# Patient Record
Sex: Female | Born: 1935 | Race: White | Hispanic: No | Smoking: Never smoker
Health system: Southern US, Community
[De-identification: ages and names within clinical notes are randomized; demographics above are authoritative.]

## PROBLEM LIST (undated history)

## (undated) DIAGNOSIS — I4891 Unspecified atrial fibrillation: Secondary | ICD-10-CM

## (undated) DIAGNOSIS — I509 Heart failure, unspecified: Secondary | ICD-10-CM

## (undated) DIAGNOSIS — R809 Proteinuria, unspecified: Secondary | ICD-10-CM

## (undated) DIAGNOSIS — D649 Anemia, unspecified: Secondary | ICD-10-CM

## (undated) DIAGNOSIS — E7800 Pure hypercholesterolemia, unspecified: Secondary | ICD-10-CM

## (undated) DIAGNOSIS — I059 Rheumatic mitral valve disease, unspecified: Secondary | ICD-10-CM

## (undated) HISTORY — PX: ABDOMINAL HYSTERECTOMY: SHX81

## (undated) HISTORY — PX: TOTAL KNEE ARTHROPLASTY: SHX125

## (undated) HISTORY — PX: CARDIAC SURGERY: SHX584

---

## 2005-09-05 ENCOUNTER — Other Ambulatory Visit: Payer: Self-pay

## 2005-09-05 ENCOUNTER — Emergency Department: Payer: Self-pay | Admitting: Emergency Medicine

## 2005-12-02 ENCOUNTER — Ambulatory Visit: Payer: Self-pay | Admitting: Ophthalmology

## 2006-02-12 ENCOUNTER — Ambulatory Visit: Payer: Self-pay | Admitting: Ophthalmology

## 2006-02-19 ENCOUNTER — Ambulatory Visit: Payer: Self-pay | Admitting: Ophthalmology

## 2006-04-24 ENCOUNTER — Ambulatory Visit: Payer: Self-pay | Admitting: Ophthalmology

## 2006-04-30 ENCOUNTER — Ambulatory Visit: Payer: Self-pay | Admitting: Ophthalmology

## 2007-04-06 ENCOUNTER — Other Ambulatory Visit: Payer: Self-pay

## 2007-04-06 ENCOUNTER — Ambulatory Visit: Payer: Self-pay | Admitting: Ophthalmology

## 2007-04-09 ENCOUNTER — Emergency Department: Payer: Self-pay | Admitting: Emergency Medicine

## 2007-05-05 ENCOUNTER — Ambulatory Visit: Payer: Self-pay | Admitting: Ophthalmology

## 2007-05-13 ENCOUNTER — Ambulatory Visit: Payer: Self-pay | Admitting: Ophthalmology

## 2008-10-27 ENCOUNTER — Ambulatory Visit: Payer: Self-pay | Admitting: Family Medicine

## 2009-10-16 ENCOUNTER — Emergency Department: Payer: Self-pay | Admitting: Unknown Physician Specialty

## 2009-10-21 ENCOUNTER — Inpatient Hospital Stay: Payer: Self-pay | Admitting: Internal Medicine

## 2009-11-24 ENCOUNTER — Ambulatory Visit: Payer: Self-pay | Admitting: Cardiology

## 2009-12-25 ENCOUNTER — Inpatient Hospital Stay: Payer: Self-pay | Admitting: *Deleted

## 2009-12-30 ENCOUNTER — Ambulatory Visit: Payer: Self-pay | Admitting: Internal Medicine

## 2010-10-18 ENCOUNTER — Ambulatory Visit: Payer: Self-pay | Admitting: Unknown Physician Specialty

## 2010-12-13 ENCOUNTER — Ambulatory Visit: Payer: Self-pay | Admitting: Pain Medicine

## 2010-12-31 ENCOUNTER — Ambulatory Visit: Payer: Self-pay | Admitting: Pain Medicine

## 2011-02-28 ENCOUNTER — Ambulatory Visit: Payer: Self-pay | Admitting: Urology

## 2011-03-14 ENCOUNTER — Ambulatory Visit: Payer: Self-pay | Admitting: Urology

## 2011-03-19 ENCOUNTER — Inpatient Hospital Stay: Payer: Self-pay | Admitting: *Deleted

## 2012-03-19 ENCOUNTER — Ambulatory Visit: Payer: Self-pay | Admitting: Family Medicine

## 2012-03-19 LAB — URINALYSIS, COMPLETE
Nitrite: POSITIVE
Protein: NEGATIVE
Specific Gravity: 1.015 (ref 1.003–1.030)

## 2012-03-26 ENCOUNTER — Emergency Department: Payer: Self-pay | Admitting: Emergency Medicine

## 2012-03-26 LAB — URINALYSIS, COMPLETE
Bacteria: NONE SEEN
Bilirubin,UR: NEGATIVE
Blood: NEGATIVE
Glucose,UR: NEGATIVE mg/dL (ref 0–75)
Nitrite: NEGATIVE
Ph: 7 (ref 4.5–8.0)
RBC,UR: 1 /HPF (ref 0–5)
Specific Gravity: 1.01 (ref 1.003–1.030)
Squamous Epithelial: 2
WBC UR: NONE SEEN /HPF (ref 0–5)

## 2012-03-26 LAB — COMPREHENSIVE METABOLIC PANEL
Albumin: 3.2 g/dL — ABNORMAL LOW (ref 3.4–5.0)
Anion Gap: 5 — ABNORMAL LOW (ref 7–16)
BUN: 23 mg/dL — ABNORMAL HIGH (ref 7–18)
Calcium, Total: 9.4 mg/dL (ref 8.5–10.1)
Chloride: 101 mmol/L (ref 98–107)
Co2: 31 mmol/L (ref 21–32)
Creatinine: 1.26 mg/dL (ref 0.60–1.30)
EGFR (African American): 47 — ABNORMAL LOW
EGFR (Non-African Amer.): 40 — ABNORMAL LOW
Potassium: 4.6 mmol/L (ref 3.5–5.1)
SGOT(AST): 23 U/L (ref 15–37)
Sodium: 137 mmol/L (ref 136–145)
Total Protein: 7.1 g/dL (ref 6.4–8.2)

## 2012-03-26 LAB — TROPONIN I: Troponin-I: <0.02 ng/mL

## 2012-03-26 LAB — TSH: Thyroid Stimulating Horm: 4.9 u[IU]/mL — ABNORMAL HIGH

## 2012-03-26 LAB — CBC
HCT: 34.7 % — ABNORMAL LOW (ref 35.0–47.0)
MCH: 26.3 pg (ref 26.0–34.0)
MCHC: 30.6 g/dL — ABNORMAL LOW (ref 32.0–36.0)
MCV: 86 fL (ref 80–100)
Platelet: 228 10*3/uL (ref 150–440)
RBC: 4.04 10*6/uL (ref 3.80–5.20)
RDW: 16.9 % — ABNORMAL HIGH (ref 11.5–14.5)
WBC: 12.2 10*3/uL — ABNORMAL HIGH (ref 3.6–11.0)

## 2012-03-26 LAB — PROTIME-INR
INR: 2.3
Prothrombin Time: 25.6 secs — ABNORMAL HIGH (ref 11.5–14.7)

## 2012-03-26 LAB — HEMOGLOBIN A1C: Hemoglobin A1C: 9.6 % — ABNORMAL HIGH (ref 4.2–6.3)

## 2012-04-20 ENCOUNTER — Ambulatory Visit: Payer: Self-pay | Admitting: Internal Medicine

## 2012-07-24 ENCOUNTER — Ambulatory Visit: Payer: Self-pay | Admitting: Internal Medicine

## 2012-08-10 ENCOUNTER — Ambulatory Visit: Payer: Self-pay | Admitting: Pain Medicine

## 2012-09-23 ENCOUNTER — Ambulatory Visit: Payer: Self-pay | Admitting: Family Medicine

## 2012-09-23 LAB — URIC ACID: Uric Acid: 3.8 mg/dL (ref 2.6–6.0)

## 2012-12-01 IMAGING — CT CT PELVIS WO/W CM
1 series · 15 of 32 positions shown, 19 images · non-contrast
Comparison: none

REASON FOR EXAM: rectal contrast  recurrent UTI eval for colovesicle
istula
COMMENTS:

PROCEDURE:     CT  - CT PELVIS STANDARD W/WO  - February 28, 2011  [DATE]
RESULT:
TECHNIQUE: Helical noncontrast 5 mm sections were obtained from the superior
iliac crest through the pubic symphysis. This study was performed as an
initial noncontrast CT of the pelvis.

[Series 2: soft tissue · axial · 0.94mm/px · z∈[-324,-70]mm · 15 of 57 slices shown, 19 images]
[im 4/57  soft-tissue]
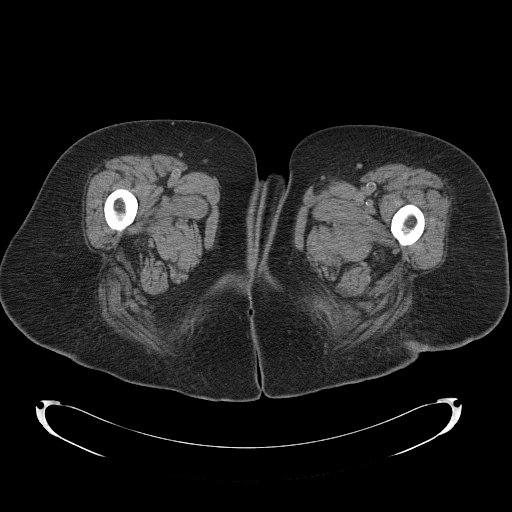
[im 4/57  bone]
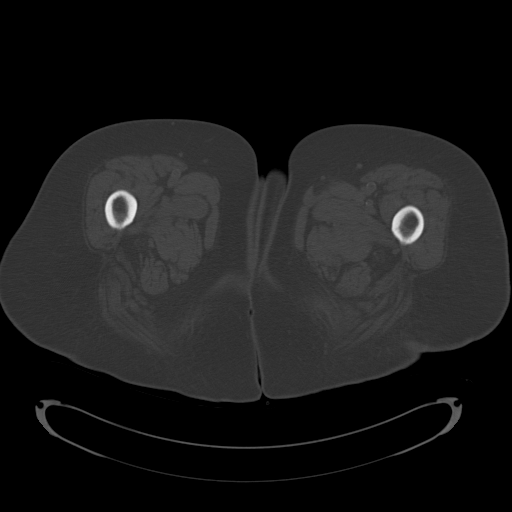
[im 8/57  soft-tissue]
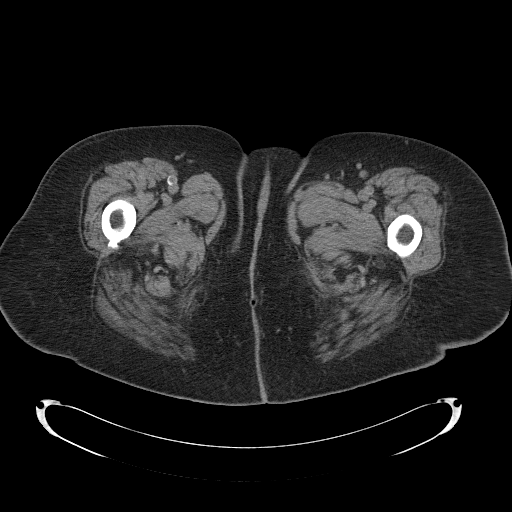
[im 11/57  soft-tissue]
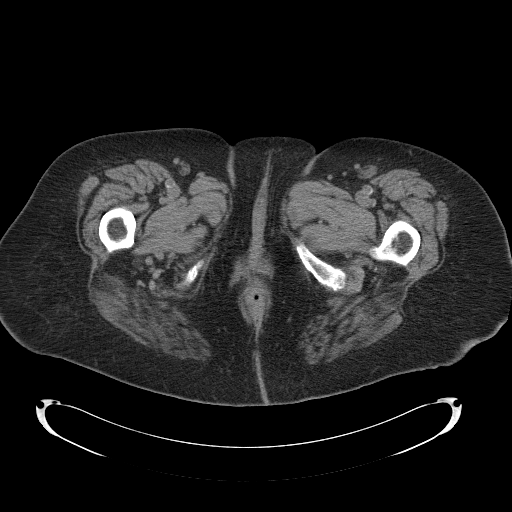
[im 17/57  soft-tissue]
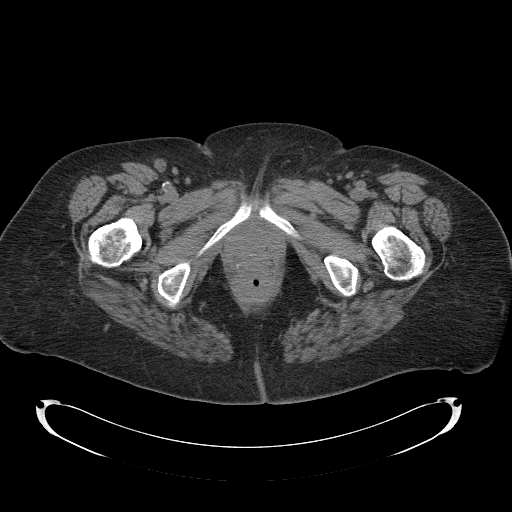
[im 20/57  soft-tissue]
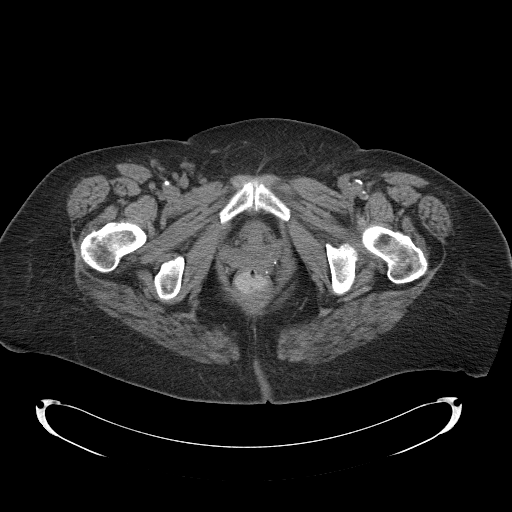
[im 24/57  soft-tissue]
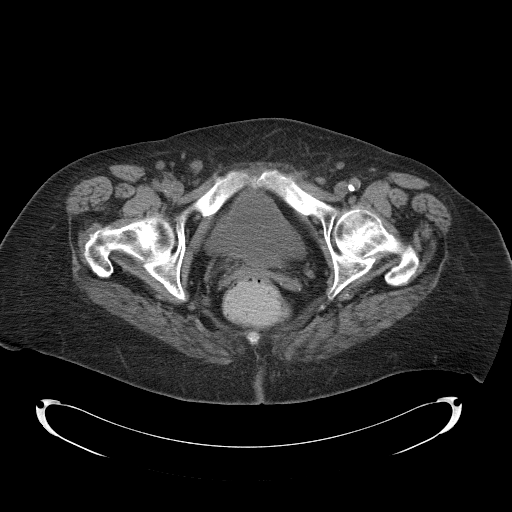
[im 29/57  soft-tissue]
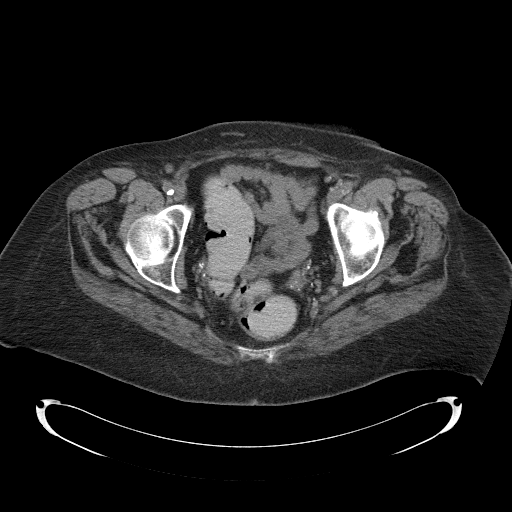
[im 33/57  soft-tissue]
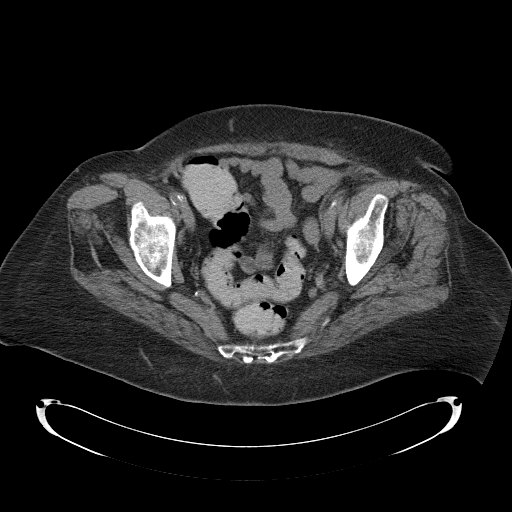
[im 37/57  soft-tissue]
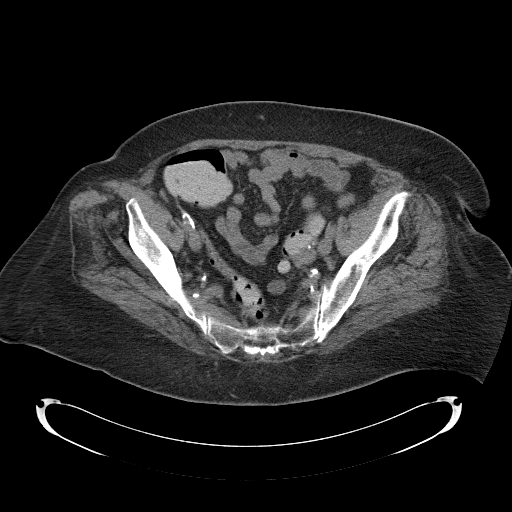
[im 37/57  bone]
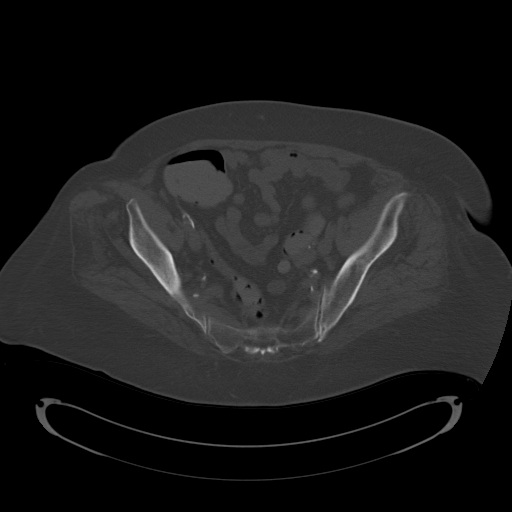
[im 40/57  soft-tissue]
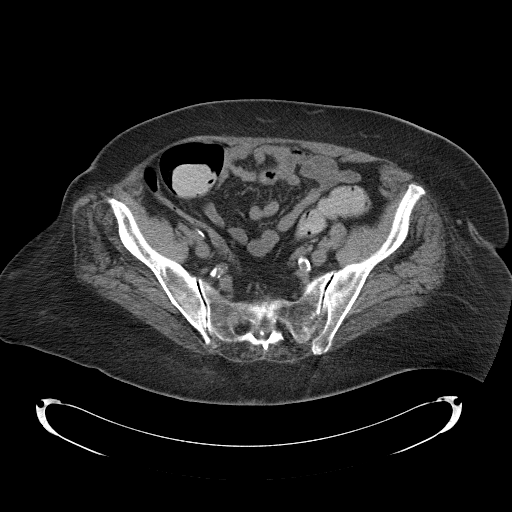
[im 46/57  soft-tissue]
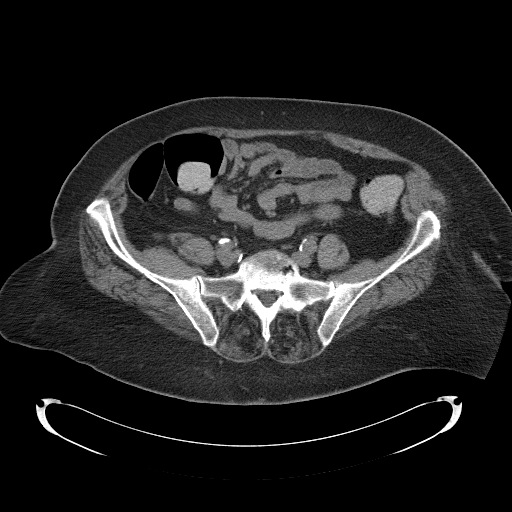
[im 49/57  soft-tissue]
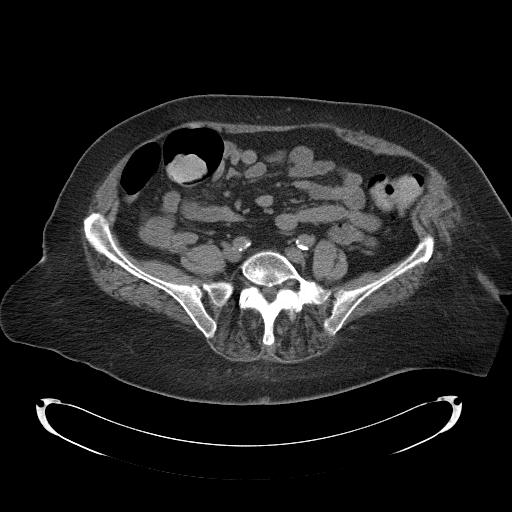
[im 49/57  lung]
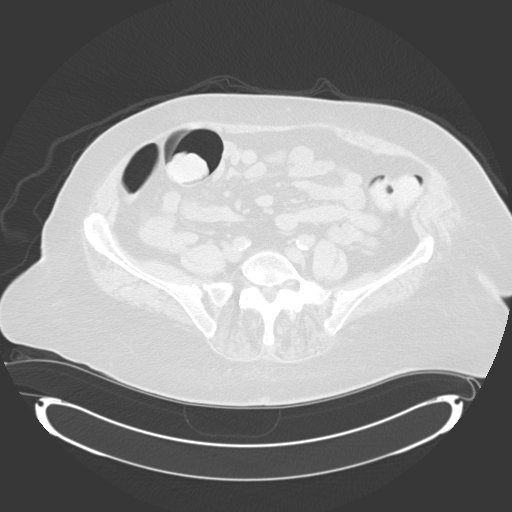
[im 51/57  lung]
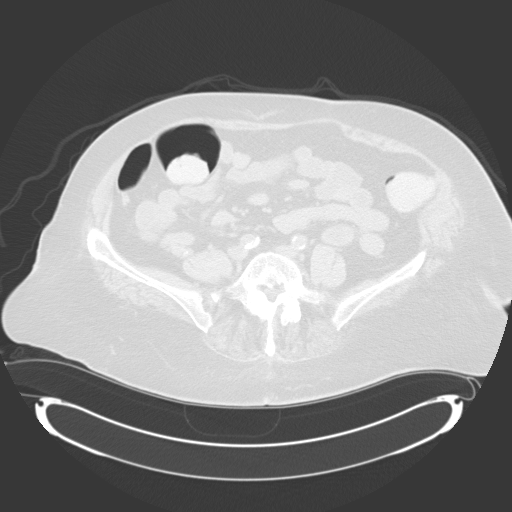
[im 53/57  soft-tissue]
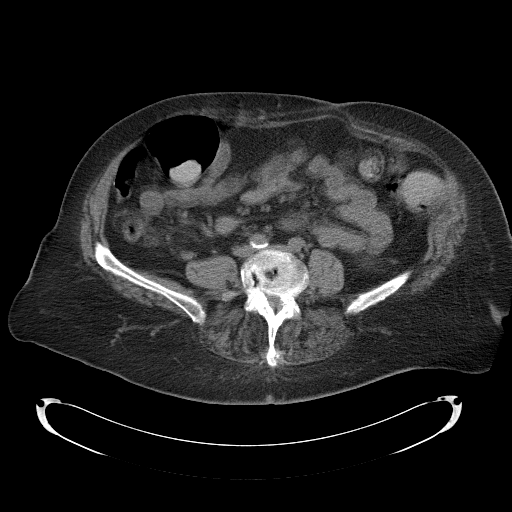
[im 53/57  lung]
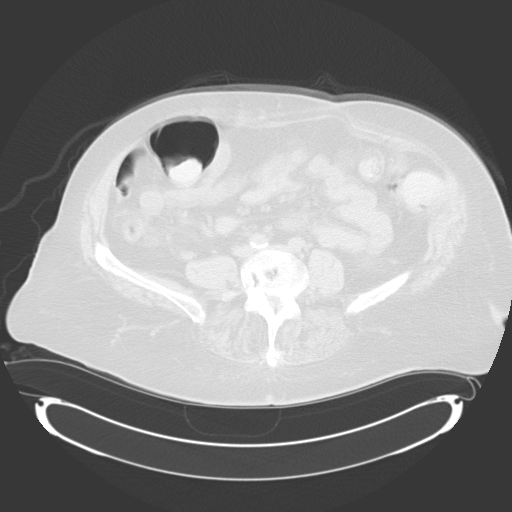
[im 55/57  lung]
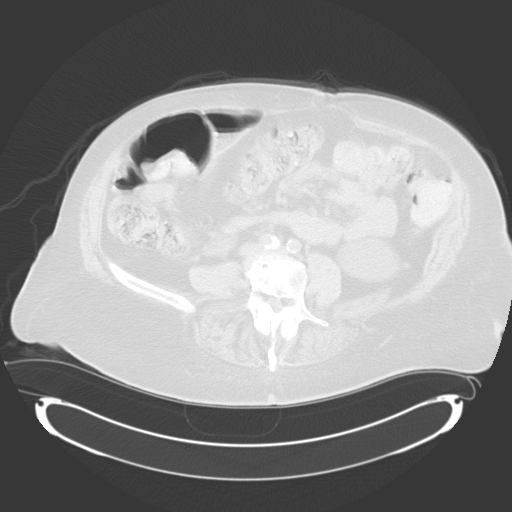

[15 of 32 positions shown; findings below may reference images not displayed]

FINDINGS: The patient was being treated with a laxative for constipation.
The laxative demonstrates radiodensity on the noncontrast CT and thus
negates the ability to evaluate for fistula. The patient has been instructed
to no longer take the laxative until an accurate study of the CT of the
pelvis can be obtained. There is no noncontrast evidence of pelvic masses,
free fluid or loculated fluid collections. Comparison is made to prior CT
dated 10/18/2010 and the inferior border of a left lower pole renal cyst is
once again appreciated. There is no CT evidence of bowel obstruction.
IMPRESSION: 1.     The patient's laxative demonstrates diffuse radiodensity within the
colon. This negates the ability of a pre-noncontrast fistula evaluation. The
patient has been instructed to discontinue laxative use until the pelvic CT
can be obtained after which laxative use can be reobtained. Otherwise, no
gross pelvic abnormalities.
2.     This study was performed at no charge to the patient.

## 2013-06-29 ENCOUNTER — Emergency Department: Payer: Self-pay | Admitting: Emergency Medicine

## 2013-06-29 LAB — CBC
HGB: 10.6 g/dL — ABNORMAL LOW (ref 12.0–16.0)
MCH: 26.4 pg (ref 26.0–34.0)
MCV: 82 fL (ref 80–100)
Platelet: 204 10*3/uL (ref 150–440)
RBC: 4 10*6/uL (ref 3.80–5.20)
RDW: 19.4 % — ABNORMAL HIGH (ref 11.5–14.5)
WBC: 10.8 10*3/uL (ref 3.6–11.0)

## 2013-06-29 LAB — PROTIME-INR: Prothrombin Time: 27.6 secs — ABNORMAL HIGH (ref 11.5–14.7)

## 2013-12-28 IMAGING — CR DG CHEST 2V
1 series · 3 of 3 positions shown · non-contrast
Comparison: none

REASON FOR EXAM: RUQ/Right flank pain
COMMENTS:

PROCEDURE:     DXR - DXR CHEST PA (OR AP) AND LATERAL  - March 26, 2012  [DATE]
RESULT:

[Series 1: pa · 0.17mm/px · 3 of 3 slices shown]
[im 1/3]
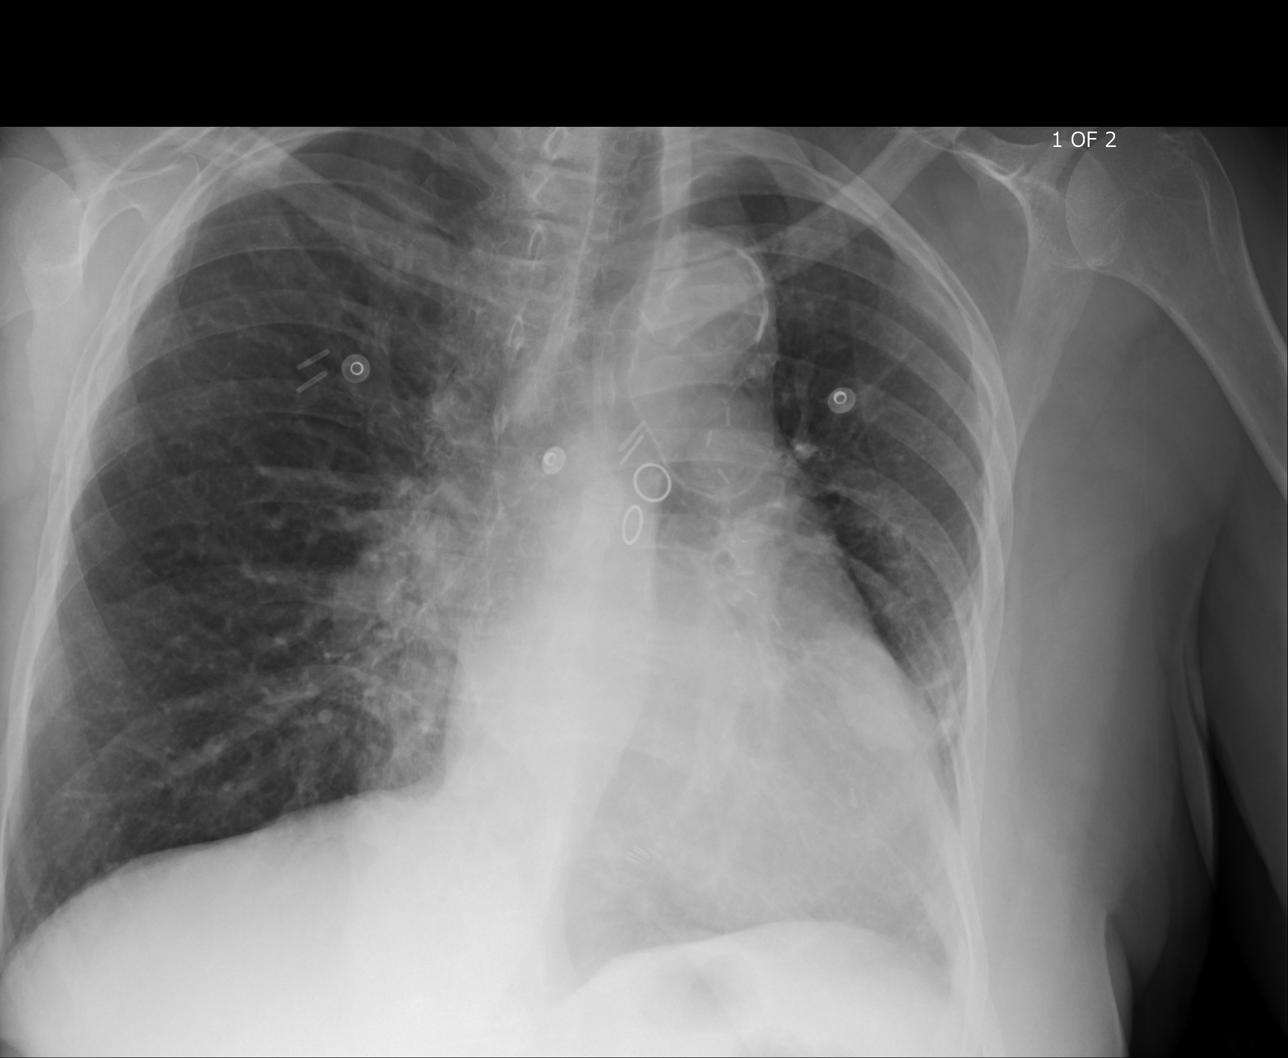
[im 2/3]
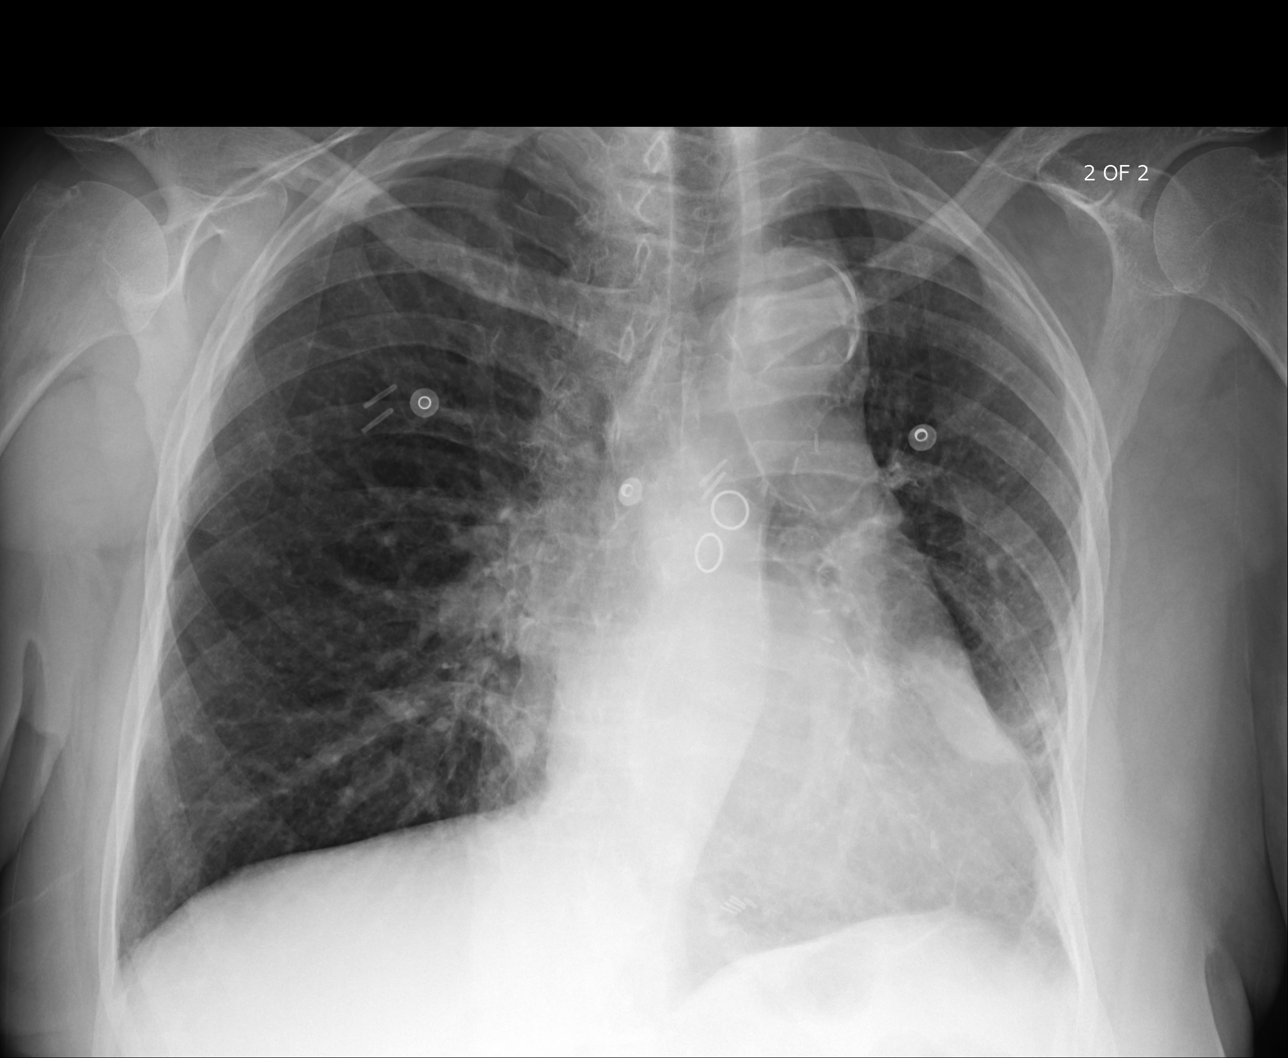
[im 3/3]
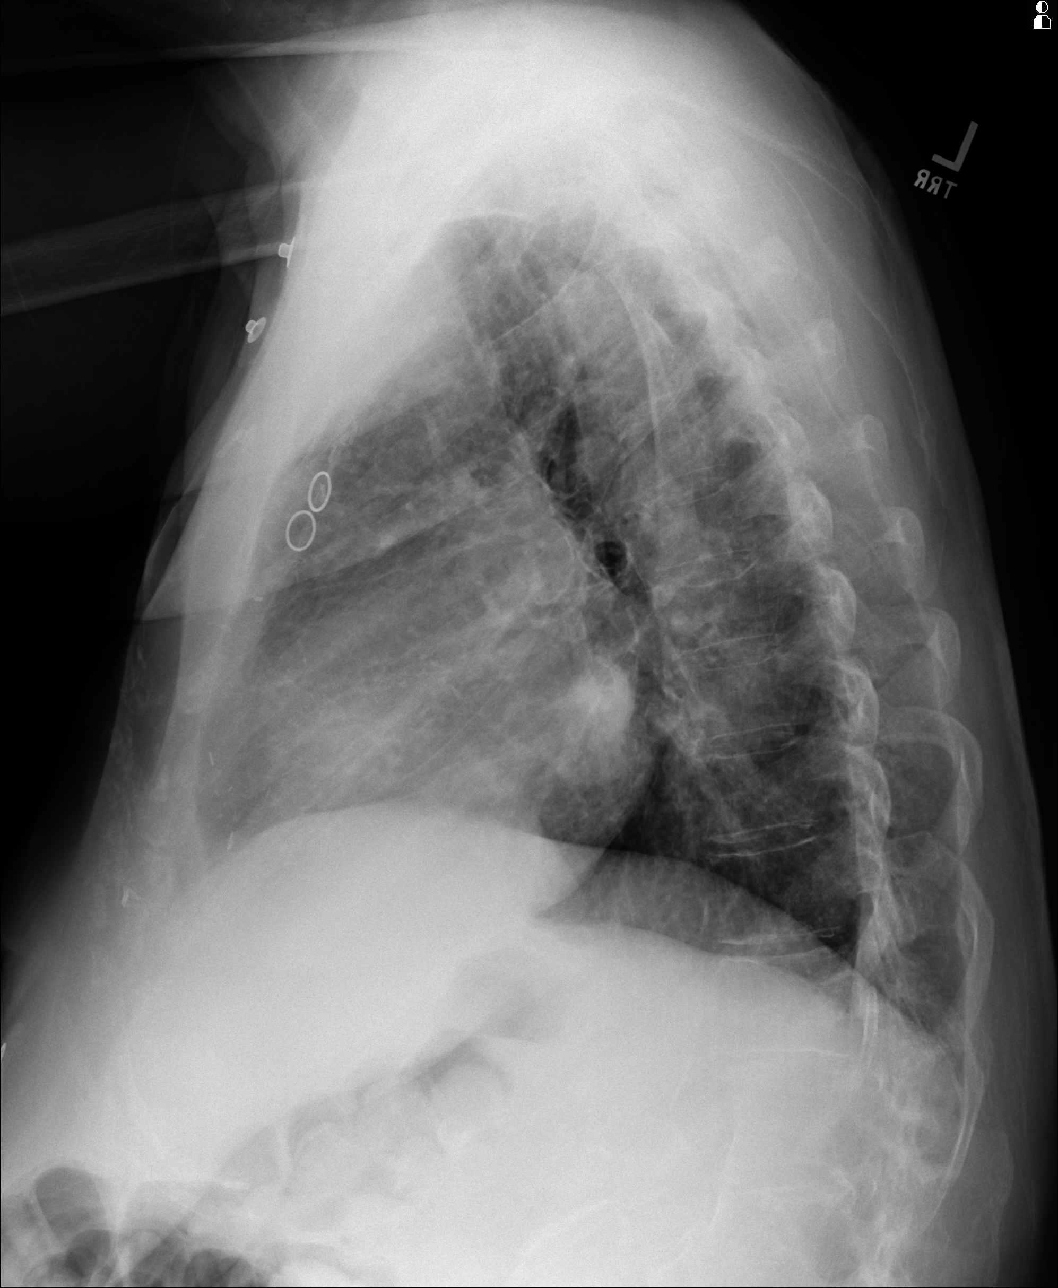

[3 of 3 positions shown; findings below may reference images not displayed]

FINDINGS: An area of increased density projects within the left perihilar
region. This area is not clearly appreciated on the previous study and has a
slightly amorphous nodular appearance. Differential considerations are an
infiltrate versus possibly atelectasis. Note, a pulmonary nodule or small
mass cannot be excluded. Repeat evaluation is recommended. If this finding
persists, further evaluation with CT is recommended.

There is prominence of the interstitial markings, mild. There does not
appear to be significant peribronchial cuffing. No further focal regions of
consolidation or focal infiltrates appreciated. The cardiac silhouette is
enlarged indicative of cardiomegaly. The visualized bony skeleton is
unremarkable.
IMPRESSION: 1.  Atelectasis versus infiltrate versus possibly a mass or nodule within
the region of the lingula.
2.  Pulmonary vascular congestion.
3.  Not mentioned above, there is a component of COPD.
4.  Surveillance and repeat evaluation recommended as described above.

## 2014-04-27 IMAGING — CR DG CHEST 2V
1 series · 2 of 2 positions shown · non-contrast
Comparison: none

REASON FOR EXAM: L shoulder hurts; usually, R shoulder hurts.  No trauma.
 Hx L lung mass.
COMMENTS:

PROCEDURE:     MDR - MDR CHEST PA(OR AP) AND LATERAL  - July 24, 2012  [DATE]
RESULT:     Comparison: None

[Series 1: pa · 0.17mm/px · 2 of 2 slices shown]
[im 1/2]
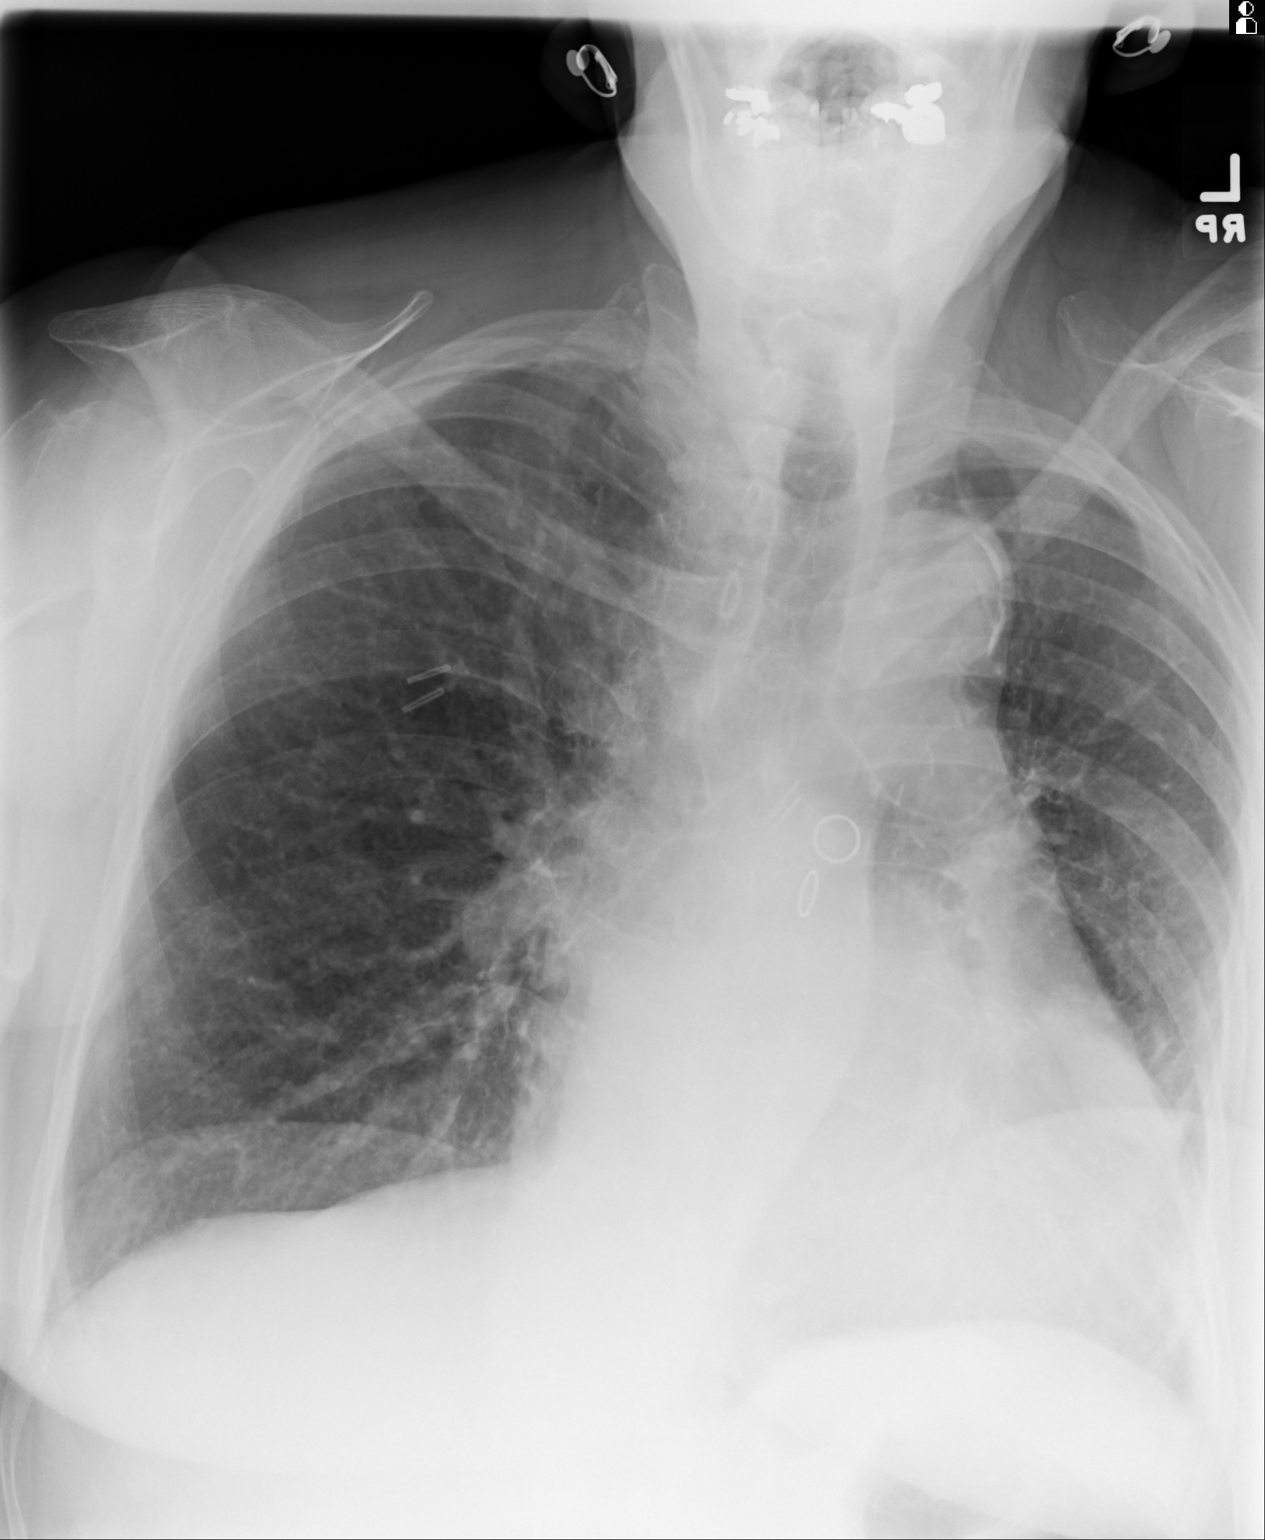
[im 2/2]
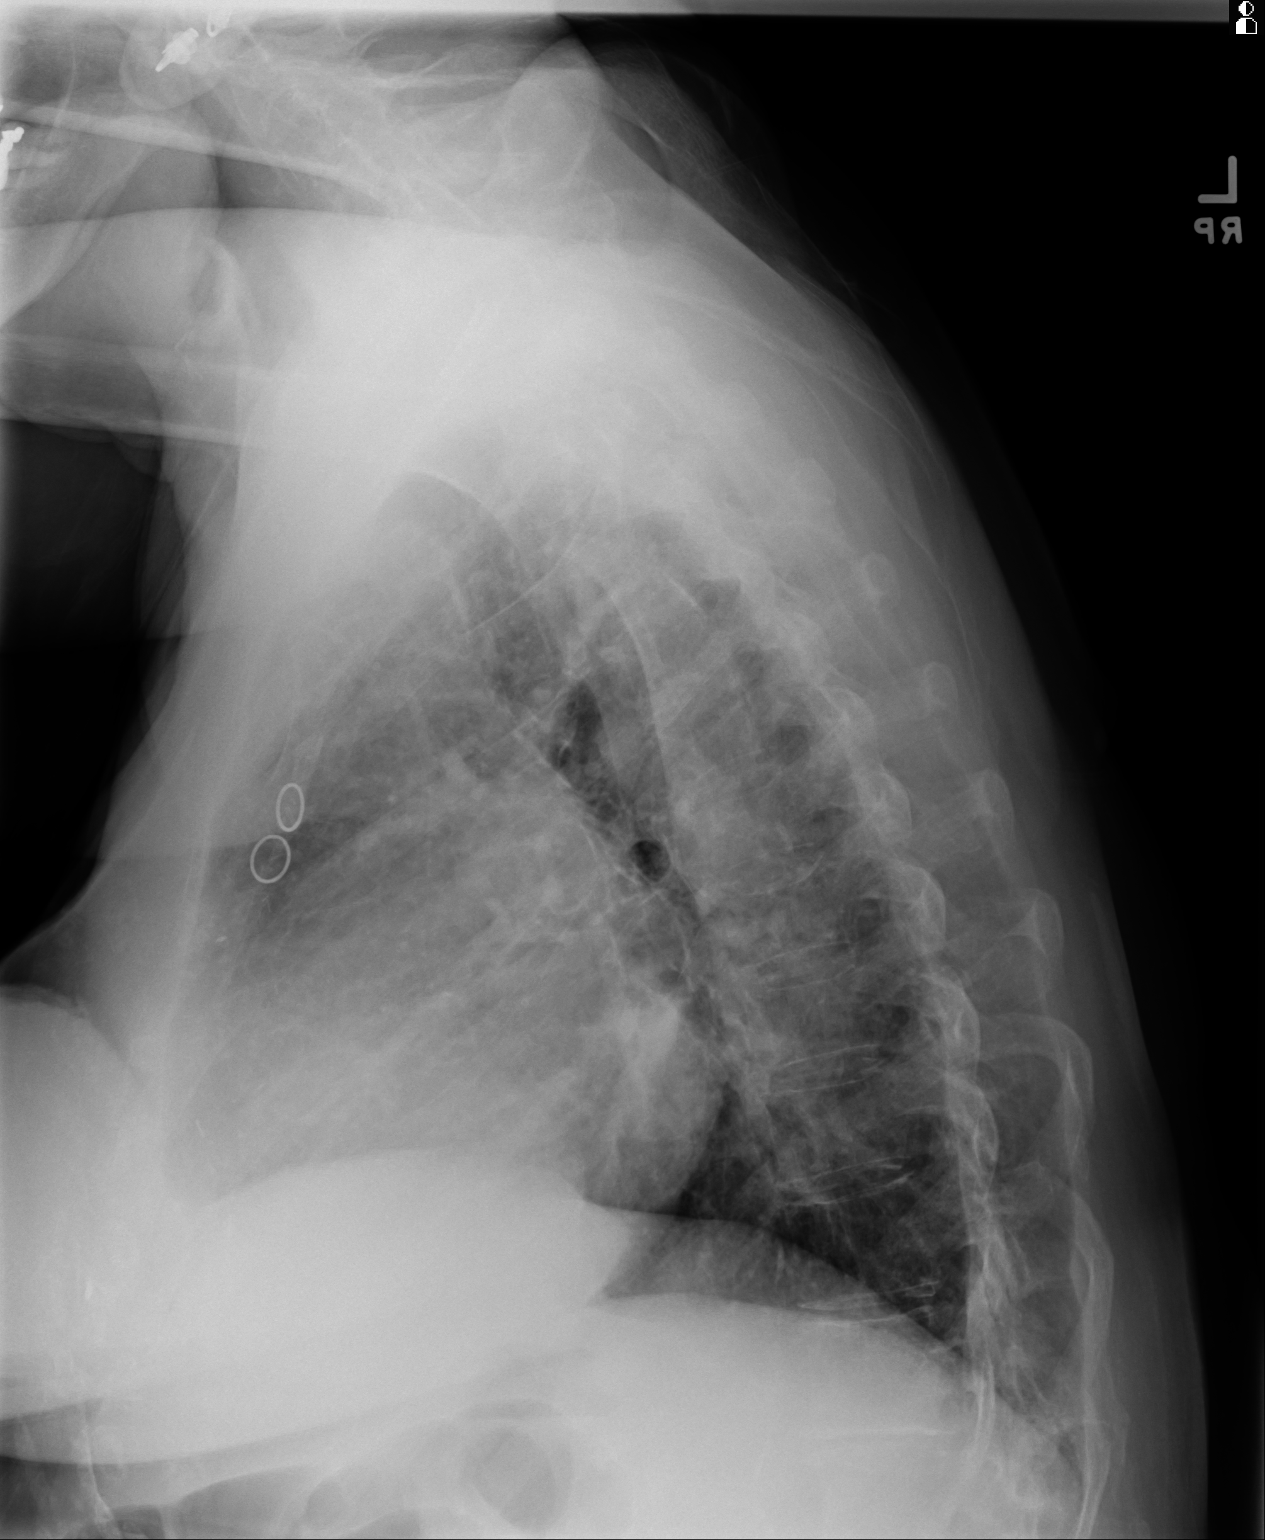

[2 of 2 positions shown; findings below may reference images not displayed]

FINDINGS: PA and lateral chest radiographs are provided. There is bilateral diffuse
interstitial thickening likely representing interstitial edema versus
interstitial pneumonitis secondary to an infectious or inflammatory
etiology. There is no focal parenchymal opacity, pleural effusion, or
pneumothorax. The heart size is enlarged. There is evidence of prior CABG.
The osseous structures are unremarkable.
IMPRESSION: Please see above.

[REDACTED]

## 2014-04-29 ENCOUNTER — Ambulatory Visit: Payer: Self-pay | Admitting: Gastroenterology

## 2014-05-02 LAB — PATHOLOGY REPORT

## 2015-01-02 ENCOUNTER — Ambulatory Visit: Admit: 2015-01-02 | Payer: Self-pay | Admitting: Internal Medicine

## 2015-01-02 ENCOUNTER — Ambulatory Visit
Admit: 2015-01-02 | Disposition: A | Discharge status: Home / Self Care | Payer: Self-pay | Attending: Internal Medicine | Admitting: Internal Medicine

## 2015-02-09 ENCOUNTER — Ambulatory Visit: Payer: Self-pay | Admitting: Internal Medicine

## 2015-02-16 ENCOUNTER — Ambulatory Visit: Payer: Self-pay | Admitting: Internal Medicine

## 2015-02-16 ENCOUNTER — Telehealth: Payer: Self-pay | Admitting: Internal Medicine

## 2015-02-16 NOTE — Telephone Encounter (Signed)
I called to R/S after 2 no shows. Her son said she is very old and that he is the one who has to take her to appts and that he wanted me to cancel and not reschedule. He also said when they went to see Dr. Elmer Ramp they were told everything was fine and he didn't understand why she needed to go to another doctor if she's fine. He said he would talk to Dr. Elmer Ramp to see if this is necessary. For now he said to cancel.

## 2015-09-14 ENCOUNTER — Emergency Department (HOSPITAL_COMMUNITY): Payer: Medicare Other

## 2015-09-14 ENCOUNTER — Inpatient Hospital Stay (HOSPITAL_COMMUNITY): Payer: Medicare Other

## 2015-09-14 ENCOUNTER — Inpatient Hospital Stay (HOSPITAL_COMMUNITY)
Admission: EM | Admit: 2015-09-14 | Discharge: 2015-09-22 | DRG: 208 | Disposition: A | Discharge status: Skilled Nursing Facility | Payer: Medicare Other | Attending: Internal Medicine | Admitting: Internal Medicine

## 2015-09-14 ENCOUNTER — Encounter (HOSPITAL_COMMUNITY): Payer: Self-pay | Admitting: Emergency Medicine

## 2015-09-14 ENCOUNTER — Observation Stay (HOSPITAL_COMMUNITY): Payer: Medicare Other

## 2015-09-14 DIAGNOSIS — I2510 Atherosclerotic heart disease of native coronary artery without angina pectoris: Secondary | ICD-10-CM | POA: Diagnosis present

## 2015-09-14 DIAGNOSIS — I5043 Acute on chronic combined systolic (congestive) and diastolic (congestive) heart failure: Secondary | ICD-10-CM | POA: Diagnosis present

## 2015-09-14 DIAGNOSIS — Z01818 Encounter for other preprocedural examination: Secondary | ICD-10-CM

## 2015-09-14 DIAGNOSIS — E039 Hypothyroidism, unspecified: Secondary | ICD-10-CM | POA: Diagnosis present

## 2015-09-14 DIAGNOSIS — I509 Heart failure, unspecified: Secondary | ICD-10-CM

## 2015-09-14 DIAGNOSIS — I10 Essential (primary) hypertension: Secondary | ICD-10-CM | POA: Diagnosis not present

## 2015-09-14 DIAGNOSIS — E785 Hyperlipidemia, unspecified: Secondary | ICD-10-CM | POA: Diagnosis present

## 2015-09-14 DIAGNOSIS — Z885 Allergy status to narcotic agent status: Secondary | ICD-10-CM

## 2015-09-14 DIAGNOSIS — I5042 Chronic combined systolic (congestive) and diastolic (congestive) heart failure: Secondary | ICD-10-CM | POA: Diagnosis present

## 2015-09-14 DIAGNOSIS — J9601 Acute respiratory failure with hypoxia: Secondary | ICD-10-CM | POA: Diagnosis present

## 2015-09-14 DIAGNOSIS — I248 Other forms of acute ischemic heart disease: Secondary | ICD-10-CM | POA: Diagnosis present

## 2015-09-14 DIAGNOSIS — I5023 Acute on chronic systolic (congestive) heart failure: Secondary | ICD-10-CM | POA: Diagnosis not present

## 2015-09-14 DIAGNOSIS — I080 Rheumatic disorders of both mitral and aortic valves: Secondary | ICD-10-CM | POA: Diagnosis present

## 2015-09-14 DIAGNOSIS — R1310 Dysphagia, unspecified: Secondary | ICD-10-CM | POA: Diagnosis present

## 2015-09-14 DIAGNOSIS — Z7984 Long term (current) use of oral hypoglycemic drugs: Secondary | ICD-10-CM

## 2015-09-14 DIAGNOSIS — M109 Gout, unspecified: Secondary | ICD-10-CM | POA: Diagnosis present

## 2015-09-14 DIAGNOSIS — G9341 Metabolic encephalopathy: Secondary | ICD-10-CM | POA: Diagnosis present

## 2015-09-14 DIAGNOSIS — Z6827 Body mass index (BMI) 27.0-27.9, adult: Secondary | ICD-10-CM

## 2015-09-14 DIAGNOSIS — I4891 Unspecified atrial fibrillation: Secondary | ICD-10-CM | POA: Insufficient documentation

## 2015-09-14 DIAGNOSIS — R0600 Dyspnea, unspecified: Secondary | ICD-10-CM

## 2015-09-14 DIAGNOSIS — Z951 Presence of aortocoronary bypass graft: Secondary | ICD-10-CM

## 2015-09-14 DIAGNOSIS — I130 Hypertensive heart and chronic kidney disease with heart failure and stage 1 through stage 4 chronic kidney disease, or unspecified chronic kidney disease: Secondary | ICD-10-CM | POA: Diagnosis present

## 2015-09-14 DIAGNOSIS — I459 Conduction disorder, unspecified: Secondary | ICD-10-CM | POA: Diagnosis present

## 2015-09-14 DIAGNOSIS — J209 Acute bronchitis, unspecified: Secondary | ICD-10-CM | POA: Diagnosis present

## 2015-09-14 DIAGNOSIS — Z8249 Family history of ischemic heart disease and other diseases of the circulatory system: Secondary | ICD-10-CM

## 2015-09-14 DIAGNOSIS — J810 Acute pulmonary edema: Secondary | ICD-10-CM

## 2015-09-14 DIAGNOSIS — E7800 Pure hypercholesterolemia, unspecified: Secondary | ICD-10-CM | POA: Diagnosis present

## 2015-09-14 DIAGNOSIS — G4700 Insomnia, unspecified: Secondary | ICD-10-CM | POA: Diagnosis not present

## 2015-09-14 DIAGNOSIS — R0902 Hypoxemia: Secondary | ICD-10-CM

## 2015-09-14 DIAGNOSIS — Z794 Long term (current) use of insulin: Secondary | ICD-10-CM

## 2015-09-14 DIAGNOSIS — R29898 Other symptoms and signs involving the musculoskeletal system: Secondary | ICD-10-CM | POA: Diagnosis present

## 2015-09-14 DIAGNOSIS — E1165 Type 2 diabetes mellitus with hyperglycemia: Secondary | ICD-10-CM | POA: Diagnosis present

## 2015-09-14 DIAGNOSIS — I4892 Unspecified atrial flutter: Secondary | ICD-10-CM | POA: Diagnosis present

## 2015-09-14 DIAGNOSIS — M545 Low back pain: Secondary | ICD-10-CM | POA: Diagnosis present

## 2015-09-14 DIAGNOSIS — Z66 Do not resuscitate: Secondary | ICD-10-CM | POA: Diagnosis not present

## 2015-09-14 DIAGNOSIS — Z886 Allergy status to analgesic agent status: Secondary | ICD-10-CM

## 2015-09-14 DIAGNOSIS — J9600 Acute respiratory failure, unspecified whether with hypoxia or hypercapnia: Secondary | ICD-10-CM | POA: Diagnosis present

## 2015-09-14 DIAGNOSIS — N183 Chronic kidney disease, stage 3 (moderate): Secondary | ICD-10-CM | POA: Diagnosis present

## 2015-09-14 DIAGNOSIS — Z7901 Long term (current) use of anticoagulants: Secondary | ICD-10-CM

## 2015-09-14 DIAGNOSIS — N179 Acute kidney failure, unspecified: Secondary | ICD-10-CM | POA: Insufficient documentation

## 2015-09-14 DIAGNOSIS — Z91048 Other nonmedicinal substance allergy status: Secondary | ICD-10-CM

## 2015-09-14 DIAGNOSIS — E1122 Type 2 diabetes mellitus with diabetic chronic kidney disease: Secondary | ICD-10-CM | POA: Diagnosis present

## 2015-09-14 DIAGNOSIS — E876 Hypokalemia: Secondary | ICD-10-CM | POA: Diagnosis not present

## 2015-09-14 DIAGNOSIS — R918 Other nonspecific abnormal finding of lung field: Secondary | ICD-10-CM | POA: Diagnosis present

## 2015-09-14 DIAGNOSIS — I441 Atrioventricular block, second degree: Secondary | ICD-10-CM | POA: Diagnosis present

## 2015-09-14 DIAGNOSIS — G8929 Other chronic pain: Secondary | ICD-10-CM | POA: Diagnosis present

## 2015-09-14 DIAGNOSIS — I482 Chronic atrial fibrillation, unspecified: Secondary | ICD-10-CM | POA: Diagnosis present

## 2015-09-14 DIAGNOSIS — T502X5A Adverse effect of carbonic-anhydrase inhibitors, benzothiadiazides and other diuretics, initial encounter: Secondary | ICD-10-CM | POA: Diagnosis not present

## 2015-09-14 DIAGNOSIS — D696 Thrombocytopenia, unspecified: Secondary | ICD-10-CM | POA: Diagnosis present

## 2015-09-14 DIAGNOSIS — E119 Type 2 diabetes mellitus without complications: Secondary | ICD-10-CM

## 2015-09-14 DIAGNOSIS — Z825 Family history of asthma and other chronic lower respiratory diseases: Secondary | ICD-10-CM

## 2015-09-14 DIAGNOSIS — Z833 Family history of diabetes mellitus: Secondary | ICD-10-CM

## 2015-09-14 DIAGNOSIS — E872 Acidosis: Secondary | ICD-10-CM | POA: Diagnosis present

## 2015-09-14 DIAGNOSIS — J9602 Acute respiratory failure with hypercapnia: Secondary | ICD-10-CM | POA: Diagnosis present

## 2015-09-14 DIAGNOSIS — I255 Ischemic cardiomyopathy: Secondary | ICD-10-CM | POA: Diagnosis present

## 2015-09-14 DIAGNOSIS — R001 Bradycardia, unspecified: Secondary | ICD-10-CM | POA: Diagnosis not present

## 2015-09-14 DIAGNOSIS — E669 Obesity, unspecified: Secondary | ICD-10-CM | POA: Diagnosis present

## 2015-09-14 DIAGNOSIS — E1142 Type 2 diabetes mellitus with diabetic polyneuropathy: Secondary | ICD-10-CM | POA: Diagnosis present

## 2015-09-14 DIAGNOSIS — I480 Paroxysmal atrial fibrillation: Secondary | ICD-10-CM | POA: Diagnosis present

## 2015-09-14 DIAGNOSIS — N189 Chronic kidney disease, unspecified: Secondary | ICD-10-CM

## 2015-09-14 DIAGNOSIS — J851 Abscess of lung with pneumonia: Secondary | ICD-10-CM | POA: Diagnosis present

## 2015-09-14 HISTORY — DX: Heart failure, unspecified: I50.9

## 2015-09-14 HISTORY — DX: Proteinuria, unspecified: R80.9

## 2015-09-14 HISTORY — DX: Unspecified atrial fibrillation: I48.91

## 2015-09-14 HISTORY — DX: Pure hypercholesterolemia, unspecified: E78.00

## 2015-09-14 HISTORY — DX: Anemia, unspecified: D64.9

## 2015-09-14 HISTORY — DX: Rheumatic mitral valve disease, unspecified: I05.9

## 2015-09-14 LAB — CBC WITH DIFFERENTIAL/PLATELET
BASOS PCT: 0 %
Basophils Absolute: 0 10*3/uL (ref 0.0–0.1)
EOS ABS: 0 10*3/uL (ref 0.0–0.7)
EOS PCT: 0 %
HCT: 42.6 % (ref 36.0–46.0)
Hemoglobin: 13.6 g/dL (ref 12.0–15.0)
LYMPHS ABS: 3.5 10*3/uL (ref 0.7–4.0)
Lymphocytes Relative: 38 %
MCH: 28.8 pg (ref 26.0–34.0)
MCHC: 31.9 g/dL (ref 30.0–36.0)
MCV: 90.1 fL (ref 78.0–100.0)
MONOS PCT: 15 %
Monocytes Absolute: 1.4 10*3/uL — ABNORMAL HIGH (ref 0.1–1.0)
Neutro Abs: 4.4 10*3/uL (ref 1.7–7.7)
Neutrophils Relative %: 47 %
PLATELETS: 149 10*3/uL — AB (ref 150–400)
RBC: 4.73 MIL/uL (ref 3.87–5.11)
RDW: 15.4 % (ref 11.5–15.5)
WBC: 9.3 10*3/uL (ref 4.0–10.5)

## 2015-09-14 LAB — GLUCOSE, CAPILLARY
GLUCOSE-CAPILLARY: 151 mg/dL — AB (ref 65–99)
Glucose-Capillary: 151 mg/dL — ABNORMAL HIGH (ref 65–99)

## 2015-09-14 LAB — URINALYSIS, ROUTINE W REFLEX MICROSCOPIC
BILIRUBIN URINE: NEGATIVE
GLUCOSE, UA: NEGATIVE mg/dL
Ketones, ur: NEGATIVE mg/dL
Leukocytes, UA: NEGATIVE
Nitrite: NEGATIVE
PROTEIN: 30 mg/dL — AB
Specific Gravity, Urine: 1.009 (ref 1.005–1.030)
pH: 5 (ref 5.0–8.0)

## 2015-09-14 LAB — DIGOXIN LEVEL: Digoxin Level: 0.7 ng/mL — ABNORMAL LOW (ref 0.8–2.0)

## 2015-09-14 LAB — BASIC METABOLIC PANEL
Anion gap: 12 (ref 5–15)
BUN: 21 mg/dL — AB (ref 6–20)
CALCIUM: 9.3 mg/dL (ref 8.9–10.3)
CHLORIDE: 94 mmol/L — AB (ref 101–111)
CO2: 24 mmol/L (ref 22–32)
CREATININE: 0.91 mg/dL (ref 0.44–1.00)
GFR, EST NON AFRICAN AMERICAN: 57 mL/min — AB (ref >60)
Glucose, Bld: 199 mg/dL — ABNORMAL HIGH (ref 65–99)
Potassium: 4.9 mmol/L (ref 3.5–5.1)
SODIUM: 130 mmol/L — AB (ref 135–145)

## 2015-09-14 LAB — BRAIN NATRIURETIC PEPTIDE: B Natriuretic Peptide: 746.6 pg/mL — ABNORMAL HIGH (ref 0.0–100.0)

## 2015-09-14 LAB — URINE MICROSCOPIC-ADD ON

## 2015-09-14 LAB — I-STAT ARTERIAL BLOOD GAS, ED
ACID-BASE EXCESS: 1 mmol/L (ref 0.0–2.0)
Acid-Base Excess: 4 mmol/L — ABNORMAL HIGH (ref 0.0–2.0)
BICARBONATE: 28.8 meq/L — AB (ref 20.0–24.0)
Bicarbonate: 32 mEq/L — ABNORMAL HIGH (ref 20.0–24.0)
O2 SAT: 97 %
O2 Saturation: 94 %
PCO2 ART: 61.8 mmHg — AB (ref 35.0–45.0)
PO2 ART: 80 mmHg (ref 80.0–100.0)
Patient temperature: 98.6
Patient temperature: 98.6
TCO2: 34 mmol/L (ref 0–100)
TCO2: 31 mmol/L (ref 0–100)
pCO2 arterial: 58.7 mmHg (ref 35.0–45.0)
pH, Arterial: 7.323 — ABNORMAL LOW (ref 7.350–7.450)
pH, Arterial: 7.299 — ABNORMAL LOW (ref 7.350–7.450)
pO2, Arterial: 100 mmHg (ref 80.0–100.0)

## 2015-09-14 LAB — TROPONIN I: TROPONIN I: 0.03 ng/mL (ref <0.031)

## 2015-09-14 LAB — LACTIC ACID, PLASMA
Lactic Acid, Venous: 1.5 mmol/L (ref 0.5–2.0)
Lactic Acid, Venous: 1.4 mmol/L (ref 0.5–2.0)

## 2015-09-14 LAB — PROTIME-INR
INR: 1.76 — AB (ref 0.00–1.49)
Prothrombin Time: 20.5 seconds — ABNORMAL HIGH (ref 11.6–15.2)

## 2015-09-14 LAB — I-STAT CG4 LACTIC ACID, ED: Lactic Acid, Venous: 2.13 mmol/L (ref 0.5–2.0)

## 2015-09-14 LAB — MRSA PCR SCREENING: MRSA BY PCR: NEGATIVE

## 2015-09-14 LAB — APTT: aPTT: 36 seconds (ref 24–37)

## 2015-09-14 MED ORDER — FUROSEMIDE 10 MG/ML IJ SOLN
80.0000 mg | Freq: Two times a day (BID) | INTRAMUSCULAR | Status: DC
  Administered 2015-09-14 (×2): 80 mg via INTRAVENOUS
  Filled 2015-09-14 (×2): qty 8

## 2015-09-14 MED ORDER — ALLOPURINOL 300 MG PO TABS
300.0000 mg | ORAL_TABLET | Freq: Every day | ORAL | Status: DC
  Administered 2015-09-14: 300 mg via ORAL
  Filled 2015-09-14 (×2): qty 1

## 2015-09-14 MED ORDER — ONDANSETRON HCL 4 MG PO TABS: 4.0000 mg | ORAL_TABLET | Freq: Four times a day (QID) | ORAL | Status: DC | PRN

## 2015-09-14 MED ORDER — TRAMADOL HCL 50 MG PO TABS
50.0000 mg | ORAL_TABLET | Freq: Four times a day (QID) | ORAL | Status: DC | PRN
  Administered 2015-09-14 – 2015-09-17 (×3): 50 mg via ORAL
  Filled 2015-09-14 (×4): qty 1

## 2015-09-14 MED ORDER — METOPROLOL TARTRATE 50 MG PO TABS
50.0000 mg | ORAL_TABLET | Freq: Two times a day (BID) | ORAL | Status: DC
  Administered 2015-09-14 (×2): 50 mg via ORAL
  Filled 2015-09-14 (×4): qty 1

## 2015-09-14 MED ORDER — ONDANSETRON HCL 4 MG/2ML IJ SOLN: 4.0000 mg | Freq: Four times a day (QID) | INTRAMUSCULAR | Status: DC | PRN

## 2015-09-14 MED ORDER — IOHEXOL 350 MG/ML SOLN
100.0000 mL | Freq: Once | INTRAVENOUS | Status: AC | PRN
  Administered 2015-09-14: 85 mL via INTRAVENOUS

## 2015-09-14 MED ORDER — LEVOTHYROXINE SODIUM 125 MCG PO TABS
125.0000 ug | ORAL_TABLET | Freq: Every day | ORAL | Status: DC
  Filled 2015-09-14 (×2): qty 1

## 2015-09-14 MED ORDER — DIGOXIN 125 MCG PO TABS
0.1250 mg | ORAL_TABLET | Freq: Every day | ORAL | Status: DC
  Administered 2015-09-14: 0.125 mg via ORAL
  Filled 2015-09-14 (×2): qty 1

## 2015-09-14 MED ORDER — LORAZEPAM 2 MG/ML IJ SOLN
0.5000 mg | Freq: Two times a day (BID) | INTRAMUSCULAR | Status: DC | PRN
  Administered 2015-09-14: 0.5 mg via INTRAVENOUS
  Filled 2015-09-14: qty 1

## 2015-09-14 MED ORDER — MORPHINE SULFATE (PF) 2 MG/ML IV SOLN: 2.0000 mg | INTRAVENOUS | Status: DC | PRN

## 2015-09-14 MED ORDER — FUROSEMIDE 10 MG/ML IJ SOLN
40.0000 mg | Freq: Once | INTRAMUSCULAR | Status: AC
  Administered 2015-09-14: 40 mg via INTRAVENOUS
  Filled 2015-09-14: qty 4

## 2015-09-14 MED ORDER — COLCHICINE 0.6 MG PO TABS
0.6000 mg | ORAL_TABLET | Freq: Every day | ORAL | Status: DC
  Administered 2015-09-14: 0.6 mg via ORAL
  Filled 2015-09-14 (×2): qty 1

## 2015-09-14 MED ORDER — INSULIN ASPART 100 UNIT/ML ~~LOC~~ SOLN: 0.0000 [IU] | Freq: Three times a day (TID) | SUBCUTANEOUS | Status: DC

## 2015-09-14 MED ORDER — WARFARIN SODIUM 5 MG PO TABS
6.0000 mg | ORAL_TABLET | Freq: Once | ORAL | Status: AC
  Administered 2015-09-14: 6 mg via ORAL
  Filled 2015-09-14 (×2): qty 1

## 2015-09-14 MED ORDER — HYDRALAZINE HCL 20 MG/ML IJ SOLN
10.0000 mg | Freq: Once | INTRAMUSCULAR | Status: DC
Start: 2015-09-14 — End: 2015-09-19
  Filled 2015-09-14: qty 1

## 2015-09-14 MED ORDER — GABAPENTIN 300 MG PO CAPS
300.0000 mg | ORAL_CAPSULE | Freq: Three times a day (TID) | ORAL | Status: DC
  Administered 2015-09-14 (×2): 300 mg via ORAL
  Filled 2015-09-14 (×5): qty 1

## 2015-09-14 MED ORDER — WARFARIN - PHARMACIST DOSING INPATIENT: Freq: Every day | Status: DC

## 2015-09-14 MED ORDER — DOCUSATE SODIUM 100 MG PO CAPS
100.0000 mg | ORAL_CAPSULE | Freq: Every day | ORAL | Status: DC
  Administered 2015-09-14: 100 mg via ORAL
  Filled 2015-09-14 (×2): qty 1

## 2015-09-14 MED ORDER — SIMVASTATIN 20 MG PO TABS
20.0000 mg | ORAL_TABLET | Freq: Every day | ORAL | Status: DC
  Administered 2015-09-14: 20 mg via ORAL
  Filled 2015-09-14 (×2): qty 1

## 2015-09-14 MED ORDER — POTASSIUM CHLORIDE CRYS ER 20 MEQ PO TBCR
20.0000 meq | EXTENDED_RELEASE_TABLET | Freq: Two times a day (BID) | ORAL | Status: DC
  Administered 2015-09-14: 20 meq via ORAL
  Filled 2015-09-14: qty 1

## 2015-09-14 MED ORDER — HYDRALAZINE HCL 20 MG/ML IJ SOLN: 10.0000 mg | INTRAMUSCULAR | Status: DC | PRN

## 2015-09-14 MED ORDER — LISINOPRIL 10 MG PO TABS
10.0000 mg | ORAL_TABLET | Freq: Every day | ORAL | Status: DC
  Administered 2015-09-14: 10 mg via ORAL
  Filled 2015-09-14 (×2): qty 1

## 2015-09-14 MED ORDER — SODIUM CHLORIDE 0.9 % IJ SOLN
3.0000 mL | Freq: Two times a day (BID) | INTRAMUSCULAR | Status: DC
  Administered 2015-09-14 – 2015-09-22 (×15): 3 mL via INTRAVENOUS

## 2015-09-14 NOTE — ED Notes (Signed)
RT at bedside - pt off bi-pap and on [address] 4L. Will assess if pt maintains O2 sats.

## 2015-09-14 NOTE — ED Notes (Signed)
Monitor indicates hr of 147 and pulse 86 confirmed at radial. Regular perfused pulse of 86. MD aware

## 2015-09-14 NOTE — Plan of Care (Signed)
Problem: Education: Goal: Knowledge of [facility] Education information/materials will improve Outcome: Completed/Met Date Met:  09/14/15 Pt educated and verbalized understanding

## 2015-09-14 NOTE — Progress Notes (Signed)
Patient transferred to CT and back to ED room B-18 without any apparent complications. RT will continue to monitor.

## 2015-09-14 NOTE — ED Notes (Addendum)
Daughter called from [place] -- [phone] --- [name]

## 2015-09-14 NOTE — ED Notes (Signed)
EMS - Patient coming from home with c/o of SOB x couple of days.  Patient seen by PCP today and started on Levoquin with "no immediate improvement".  Hx. Of CHF.  Given 125mg  Solumderol in route.  CPAP on arrival.  Diagnosed with acute bronchitis.  Room air at 87%.

## 2015-09-14 NOTE — ED Provider Notes (Signed)
CSN: [id number]     Arrival date & time 09/14/15  0050 History  By signing my name below, I, Lori Harrington, attest that this documentation has been prepared under the direction and in the presence of Lori Kaplan, MD . Electronically Signed: Freida Harrington, Scribe. 09/14/2015. 2:30 AM.    Chief Complaint  Patient presents with  . Shortness of Breath   LEVEL 5 CAVEAT DUE TO ACUITY OF MEDICAL CONDITION  The history is provided by the patient and the EMS personnel. No language interpreter was used.    HPI Comments:  Lori Harrington is a 82 y.o. female with a history of CHF, AFIB, and failed CABG, who presents to the Emergency Department via ems from home for SOB x 2 days; worse today. Pt saw pcp yesterday (09/13/15) for SOB, was diagnosed with bronchitis and started on Levaquin. EMS reports  rhonci bilaterally and wet cough; pt notes yellow phlegm. Pt reports central CP secondary to cough. Per EMS pt was hypoxic on RA at 87%. She was placed on  CPAP and O2 saturation rose to 100%. Pt was given solumedrol en route and 20 gauge IV was placed in the left AC. Pt denies smoking history and fever.   Past Medical History  Diagnosis Date  . CHF (congestive heart failure) (HCC)   . Anemia   . Mitral valve disorder   . Atrial fibrillation (HCC)   . Pure hypercholesterolemia   . Proteinuria    Past Surgical History  Procedure Laterality Date  . Abdominal hysterectomy    . Cardiac surgery    . Total knee arthroplasty Left    No family history on file. Social History  Substance Use Topics  . Smoking status: Never Smoker   . Smokeless tobacco: None  . Alcohol Use: No   OB History    No data available     Review of Systems  Unable to perform ROS: Acuity of condition    Allergies  Adhesive; Codeine; and Oxycodone  Home Medications   Prior to Admission medications   Not on File   BP 166/77 mmHg  Pulse 70  Resp 24  SpO2 100% Physical Exam  Constitutional: She is oriented to person,  place, and time. She appears well-developed and well-nourished.  HENT:  Head: Normocephalic and atraumatic.  Eyes: Conjunctivae are normal.  Neck: JVD present.  Cardiovascular: Normal rate and regular rhythm.   Pulmonary/Chest: She is in respiratory distress (with retractions over chest).  Diffuse rhonchus BS with bibasilar rales   Abdominal: She exhibits no distension.  Musculoskeletal: She exhibits edema (2+ pitting BLE edema).  Neurological: She is alert and oriented to person, place, and time.  Skin: Skin is warm. She is diaphoretic.  Psychiatric: She has a normal mood and affect.  Nursing note and vitals reviewed.   ED Course  Procedures   DIAGNOSTIC STUDIES:  Oxygen Saturation is 100% on CPAP, normal by my interpretation.    2:24 AM Pt updated with results with son at bedside. Son notes pt is currently on lasix and a blood thinner for AFIB.   Labs Review Labs Reviewed  CBC WITH DIFFERENTIAL/PLATELET - Abnormal; Notable for the following:    Platelets 149 (*)    Monocytes Absolute 1.4 (*)    All other components within normal limits  BASIC METABOLIC PANEL - Abnormal; Notable for the following:    Sodium 130 (*)    Chloride 94 (*)    Glucose, Bld 199 (*)    BUN 21 (*)  GFR calc non Af Amer 57 (*)    All other components within normal limits  BRAIN NATRIURETIC PEPTIDE - Abnormal; Notable for the following:    B Natriuretic Peptide 746.6 (*)    All other components within normal limits  PROTIME-INR - Abnormal; Notable for the following:    Prothrombin Time 20.5 (*)    INR 1.76 (*)    All other components within normal limits  I-STAT CG4 LACTIC ACID, ED - Abnormal; Notable for the following:    Lactic Acid, Venous 2.13 (*)    All other components within normal limits  TROPONIN I  APTT  URINALYSIS, ROUTINE W REFLEX MICROSCOPIC (NOT AT [facility])    Imaging Review Dg Chest Port 1 View  09/14/2015  CLINICAL DATA:  Acute onset of respiratory distress and shortness of  breath. Initial encounter. EXAM: PORTABLE CHEST 1 VIEW COMPARISON:  None. FINDINGS: The lungs are well-aerated. Vascular congestion is noted. Mildly increased interstitial markings may reflect mild interstitial edema. No definite pleural effusion or pneumothorax is seen. The cardiomediastinal silhouette is mildly enlarged. The patient is status post CABG. No acute osseous abnormalities are seen. IMPRESSION: Vascular congestion and mild cardiomegaly. Mildly increased interstitial markings may reflect mild interstitial edema. Electronically Signed   By: Roanna RaiderJeffery  Chang M.D.   On: 09/14/2015 01:59   I have personally reviewed and evaluated these images and lab results as part of my medical decision-making.   EKG Interpretation   Date/Time:  Thursday September 14 2015 01:01:10 EST Ventricular Rate:  125 PR Interval:    QRS Duration: 131 QT Interval:  339 QTC Calculation: 489 R Axis:   -70 Text Interpretation:  Atrial flutter with predominant 2:1 AV block LVH  with IVCD, LAD and secondary repol abnrm Inferior infarct, old Nonspecific  ST and T wave abnormality No acute changes Confirmed by Rhunette CroftNANAVATI, MD,  Janey GentaANKIT [phone]([id number]) on 09/14/2015 1:18:32 AM      MDM   Final diagnoses:  Acute respiratory failure with hypoxia (HCC)  Acute pulmonary edema (HCC)  Acute on chronic congestive heart failure, unspecified congestive heart failure type (HCC)   I personally performed the services described in this documentation, which was scribed in my presence. The recorded information has been reviewed and is accurate.  Pt comes in with acute hypoxic resp failure. Treated as bronchitis for the cough and wheezing by pcp, with levaquin. Acutely worsened overnight. She is tachycardic and tachypneic - and her acute presentation is consistent with acute hypoxic resp failure from pulmonary edema/flash pulm edema. She has hx of CHF and afib and is tachycardic. She is taking antibitiocs that were prescribed and has no risk  factors for PE. + jvd, bnp elevation w/o and WC elevation, and no fevers, solikely best to continue on levaquin w/o escalation of antibiotics for now.  Will continue bipap.  CRITICAL CARE Performed by: Lori KaplanNanavati, Patryce Depriest   Total critical care time:48 minutes  Critical care time was exclusive of separately billable procedures and treating other patients.  Critical care was necessary to treat or prevent imminent or life-threatening deterioration.  Critical care was time spent personally by me on the following activities: development of treatment plan with patient and/or surrogate as well as nursing, discussions with consultants, evaluation of patient's response to treatment, examination of patient, obtaining history from patient or surrogate, ordering and performing treatments and interventions, ordering and review of laboratory studies, ordering and review of radiographic studies, pulse oximetry and re-evaluation of patient's condition.   Lori KaplanAnkit Railey Glad, MD 10/14/15 [phone]

## 2015-09-14 NOTE — Progress Notes (Signed)
Patient is refusing to wear CPAP. BIPAP machine is in the room. Patient is on 3 LPM nasal cannula. No distress. SPO2 99%. RT will continue to monitor.

## 2015-09-14 NOTE — ED Notes (Signed)
Pt c/o difficulty breathing on [place] - RT at bedside to place pt on bipap

## 2015-09-14 NOTE — Progress Notes (Signed)
RT increased patients bi-pap settings to help with the increased CO2. RT will continue to monitor.

## 2015-09-14 NOTE — H&P (Signed)
[facility] History and Physical  Lori Harrington ZOX:[id number] DOB: 10/31/1929 DOA: 09/14/2015  Referring physician: Emergency Department PCP: No primary care provider on file.   CHIEF COMPLAINT:   Shortness of breath    HPI: Lori Harrington is a 82 y.o. female with multiple medical problems. She desaturates off Bipap so history is difficult to take. Patient's medical providers are from [facility] where she lives. Patient states she became SOB yesterday. No associated chest pain. She is taking home meds. She was hypoxic in 80's when EMS arrived, placed on cpcp.  Records reviewed in Care Everywhere. Patient prescribed Levaquin for cough and sinusitis yesterday by PCP.   ED COURSE:           Labs:   Na+ 130, BUN 21, Cr 0.91, glucose 199 BNP 746, trop 0.03 Lactic acid 2.13 WBC 9.3, hgb 13.6 , platelets 149 INR 1.76  CXR:    Vascular congestion and mild cardiomegaly. Mildly increased interstitial markings may reflect mild interstitial edema.         EKG:    Atrial flutter with predominant 2:1 AV block LVH with IVCD, LAD and secondary repol abnrm Inferior infarct, old Nonspecific ST and T wave abnormality No acute changes Confirmed by Rhunette Croft, MD, Janey Genta [phone]) on 09/14/2015 1:18:32 AM                   Medications  hydrALAZINE (APRESOLINE) injection 10 mg (10 mg Intravenous Not Given 09/14/15 0256)  hydrALAZINE (APRESOLINE) injection 10 mg (not administered)  furosemide (LASIX) injection 40 mg (40 mg Intravenous Given 09/14/15 0257)    Review of Systems  Unable to perform ROS: acuity of condition  Limited ROS  Positive for chronic BLE edema, L>R. No chest pain. No significant cough.  Limited review of systems, patient unable to be off bipap without SOB and desaturation   Past Medical History  Diagnosis Date  . CHF (congestive heart failure) (HCC)   . Anemia   . Mitral valve disorder   . Atrial fibrillation (HCC)   . Pure hypercholesterolemia   . Proteinuria    Past  Surgical History  Procedure Laterality Date  . Abdominal hysterectomy    . Cardiac surgery    . Total knee arthroplasty Left     SOCIAL HISTORY:  reports that she has never smoked. She does not have any smokeless tobacco history on file. She reports that she does not drink alcohol or use illicit drugs. Lives: at home alone   Assistive devices:   None needed for ambulation.   Allergies  Allergen Reactions  . Adhesive [Tape]     Skin turns red and itching   . Codeine     Stop breathing   . Oxycodone     unknown    Per records from Care Everywhere Heart attack Mother    Hypertension Mother    Asthma Mother    Coronary artery disease Father    Heart attack Father    Hypertension Father    Diabetes mellitus Brother    Coronary artery disease Brother    Diabetes mellitus Sister    Hypertension Sister    Diabetes mellitus Daughter    Colon cancer Other +FH   Heart attack Other Sibling    Relation Name Status Comments  Mother  Deceased Age:33   Father  Deceased Age:55 S/P MI  Brother  Alive S/P CABG x 3  Brother     Sister     Daughter  Other +FH    Other Sibling      Prior to Admission medications   Medication Sig Start Date End Date Taking? Authorizing Provider  allopurinol (ZYLOPRIM) 300 MG tablet Take 300 mg by mouth daily.   Yes Historical Provider, MD  Cholecalciferol (VITAMIN D-3) 1000 units CAPS Take 1 capsule by mouth daily.   Yes Historical Provider, MD  colchicine 0.6 MG tablet Take 0.6 mg by mouth daily as needed. For gout   Yes Historical Provider, MD  digoxin (LANOXIN) 0.125 MG tablet Take 0.125 mg by mouth daily.    Yes Historical Provider, MD  docusate sodium (COLACE) 100 MG capsule Take 100 mg by mouth daily.    Yes Historical Provider, MD  furosemide (LASIX) 20 MG tablet Take 20 mg by mouth See admin instructions. Son states she takes bid then the next day she will take tid   Yes Historical Provider, MD    gabapentin (NEURONTIN) 300 MG capsule Take 300 mg by mouth 3 (three) times daily.   Yes Historical Provider, MD  insulin glargine (LANTUS) 100 UNIT/ML injection Inject 40-44 Units into the skin at bedtime.   Yes Historical Provider, MD  Insulin Lispro (HUMALOG KWIKPEN [place]) Inject 20-30 units of lipase into the skin See admin instructions. Take 30 units at breakfast, 20 units at lunch, 28 units at supper per sliding scale   Yes Historical Provider, MD  levofloxacin (LEVAQUIN) 500 MG tablet Take 500 mg by mouth daily. Take for 10 days   Yes Historical Provider, MD  levothyroxine (SYNTHROID, LEVOTHROID) 125 MCG tablet Take 125 mcg by mouth daily before breakfast.   Yes Historical Provider, MD  lisinopril (PRINIVIL,ZESTRIL) 10 MG tablet Take 10 mg by mouth daily.   Yes Historical Provider, MD  metFORMIN (GLUCOPHAGE) 500 MG tablet Take 500 mg by mouth 2 (two) times daily with a meal.   Yes Historical Provider, MD  metoprolol (LOPRESSOR) 50 MG tablet Take 50 mg by mouth 2 (two) times daily.   Yes Historical Provider, MD  ondansetron (ZOFRAN) 4 MG tablet Take 4 mg by mouth every 8 (eight) hours as needed for nausea or vomiting.   Yes Historical Provider, MD  potassium chloride SA (K-DUR,KLOR-CON) 20 MEQ tablet Take 20 mEq by mouth 2 (two) times daily.   Yes Historical Provider, MD  simvastatin (ZOCOR) 20 MG tablet Take 20 mg by mouth at bedtime.   Yes Historical Provider, MD  traMADol (ULTRAM) 50 MG tablet Take 50 mg by mouth 3 (three) times daily.   Yes Historical Provider, MD  vitamin B-12 (CYANOCOBALAMIN) 1000 MCG tablet Take 1,000 mcg by mouth daily.   Yes Historical Provider, MD  warfarin (COUMADIN) 4 MG tablet Take 4 mg by mouth daily.   Yes Historical Provider, MD   PHYSICAL EXAM: Filed Vitals:   09/14/15 0615 09/14/15 0700 09/14/15 0715 09/14/15 0719  BP: 170/89 148/72 153/71 148/72  Pulse: 90 66 67 68  Temp:      TempSrc:      Resp: 26 15 13 20   SpO2: 99% 99% 98% 99%    Wt Readings from  Last 3 Encounters:  No data found for Wt    General:  Obese white female. Appears calm and comfortable Eyes: PER, normal lids, irises & conjunctiva ENT: grossly normal hearing, lips & tongue Neck: no LAD, no masses Cardiovascular: Irregular rhythm, normal rate, + murmur, BLE pitting edema, L > R.  Respiratory: Respirations labored. Inspiratory crackles / rhonchi throughout both lungs bilaterally, R> L .  Firm structure in chest wall, ? A device. Patient says not a device.  Abdomen: soft, non-distended, non-tender, active bowel sounds. No obvious masses.  Skin: no rash seen on limited exam Musculoskeletal: grossly normal tone BUE/BLE Psychiatric: grossly normal mood and affect, speech fluent and appropriate Neurologic: grossly non-focal.         LABS ON ADMISSION:    Basic Metabolic Panel:  Recent Labs Lab 09/14/15 0108  NA 130*  K 4.9  CL 94*  CO2 24  GLUCOSE 199*  BUN 21*  CREATININE 0.91  CALCIUM 9.3   CBC:  Recent Labs Lab 09/14/15 0108  WBC 9.3  NEUTROABS 4.4  HGB 13.6  HCT 42.6  MCV 90.1  PLT 149*    BNP (last 3 results)  Recent Labs  09/14/15 0108  BNP 746.6*    Creatinine clearance cannot be calculated (Unknown ideal weight.)  Radiological Exams on Admission: Dg Chest Port 1 View  09/14/2015  CLINICAL DATA:  Acute onset of respiratory distress and shortness of breath. Initial encounter. EXAM: PORTABLE CHEST 1 VIEW COMPARISON:  None. FINDINGS: The lungs are well-aerated. Vascular congestion is noted. Mildly increased interstitial markings may reflect mild interstitial edema. No definite pleural effusion or pneumothorax is seen. The cardiomediastinal silhouette is mildly enlarged. The patient is status post CABG. No acute osseous abnormalities are seen. IMPRESSION: Vascular congestion and mild cardiomegaly. Mildly increased interstitial markings may reflect mild interstitial edema. Electronically Signed   By: Roanna Raider M.D.   On: 09/14/2015 01:59     ASSESSMENT / PLAN   Acute respiratory failure with hypoxia / only mild vascular congestion on CXR and BNP only in 700s.  No history of COPD. At this point, respiratory failure presumably secondary to heart failure. Given acute onset yesterday PE not excluded especially given that INR subtherapeutic. No medical records available, patient gets care in [place].   -admit to stepdown -ABGs now -Consult Critical Care -cardiology consult  -Continue bipap, wean to [place] when tolerated -continue IV lasix. Given 40mg  IV in ED. Give another 40mg  IV now and then 40mg  IV BID. Marland Kitchen She takes 20mg  2-3 times daily at home.  -CTA chest -obtain echo -daily weights -strict I&0  Ischemic cardiomyopathy combined systolic and diastolic CHF. Per Cardiology's last office note 05/31/15 she has chronic peripheral edema. Echo 03/10/2014 revealed LV ejection fraction of 45% with moderate mitral regurgitation. -plan as aove -On diuretics / ACEI at home. Continue both.   Elevated lactic acid, likely secondary to tissue hypoxia from respiratory failure  Atrial fibrillation (HCC), on chronic coumadin. Rate controlled. CHADSVASC score 6 -monitor on telemetry -coumadin per pharmacy -continue digoxin, check dig level -continue beta blocker  History of CAD ,s/p failed CABG and MVR 2012  Thrombocytopenia.   Diabetes mellitus, type 2 with peripheral neuropathy -Check hgb a1c -hold metformin. Hold Lantus until she can tolerate being off bipap to eat -monitor CBGs, start SSI -continue Neurontin  Gout -Continue Zyloprim and colchicine    Hypothyroidism -continue home Synthroid -check TSH    HTN.  -Continue home blood pressure medications  Chronic low back pain. -continue home ultram when taking PO -Morphine IV for severe pain  Remote history of upper GI bleed, s/p angiogram / embolization of left gastric artery   CONSULTANTS:    Critical Care Medicine  Code Status: patient unsure. Wants [place] to discuss  with her son so Full code until further clarified.  DVT Prophylaxis: already anti-coagulated, on coumadin Family Communication:  Patient alert, oriented and understands  plan of care.  Disposition Plan: Discharge to home in 3-4 days   Time spent: 60 minutes Willette ClusterPaula Guenther  NP [facility] Pager [phone]

## 2015-09-14 NOTE — Progress Notes (Signed)
  Echocardiogram 2D Echocardiogram has been performed.  Lori Harrington, Lori Harrington 09/14/2015, 5:46 PM

## 2015-09-14 NOTE — Progress Notes (Signed)
ANTICOAGULATION CONSULT NOTE - Initial Consult  Pharmacy Consult for warfarin Indication: atrial fibrillation  Allergies  Allergen Reactions  . Adhesive [Tape]     Skin turns red and itching   . Codeine     Stop breathing   . Oxycodone     unknown    Patient Measurements:   Heparin Dosing Weight:   Vital Signs: Temp: 98.1 F (36.7 C) (01/05 0302) Temp Source: Rectal (01/05 0302) BP: 168/76 mmHg (01/05 0801) Pulse Rate: 108 (01/05 0801)  Labs:  Recent Labs  09/14/15 0108  HGB 13.6  HCT 42.6  PLT 149*  APTT 36  LABPROT 20.5*  INR 1.76*  CREATININE 0.91  TROPONINI 0.03    CrCl cannot be calculated (Unknown ideal weight.).   Medical History: Past Medical History  Diagnosis Date  . CHF (congestive heart failure) (HCC)   . Anemia   . Mitral valve disorder   . Atrial fibrillation (HCC)   . Pure hypercholesterolemia   . Proteinuria     Assessment: [age] yof admitted with SOB on warfarin pta for afib, continued inpatient per pharmacy. INR subtherapeutic on admit at 1.76. Hg wnl, plt 149. No bleed documented.  PTA warfarin dose: 4mg  daily - last dose pta 1/4  Goal of Therapy:  INR 2-3 Monitor platelets by anticoagulation protocol: Yes   Plan:  Warfarin 6mg  x1 dose tonight Daily INR Mon s/sx bleeding  Babs BertinHaley Oprah Camarena, PharmD, [facility] Clinical Pharmacist Pager [phone] 09/14/2015 9:38 AM

## 2015-09-15 ENCOUNTER — Observation Stay (HOSPITAL_COMMUNITY): Payer: Medicare Other

## 2015-09-15 DIAGNOSIS — I2510 Atherosclerotic heart disease of native coronary artery without angina pectoris: Secondary | ICD-10-CM

## 2015-09-15 DIAGNOSIS — Z7901 Long term (current) use of anticoagulants: Secondary | ICD-10-CM | POA: Diagnosis not present

## 2015-09-15 DIAGNOSIS — M109 Gout, unspecified: Secondary | ICD-10-CM | POA: Diagnosis present

## 2015-09-15 DIAGNOSIS — R29898 Other symptoms and signs involving the musculoskeletal system: Secondary | ICD-10-CM | POA: Diagnosis not present

## 2015-09-15 DIAGNOSIS — J9601 Acute respiratory failure with hypoxia: Secondary | ICD-10-CM | POA: Diagnosis present

## 2015-09-15 DIAGNOSIS — E7800 Pure hypercholesterolemia, unspecified: Secondary | ICD-10-CM | POA: Diagnosis present

## 2015-09-15 DIAGNOSIS — Z833 Family history of diabetes mellitus: Secondary | ICD-10-CM | POA: Diagnosis not present

## 2015-09-15 DIAGNOSIS — J810 Acute pulmonary edema: Secondary | ICD-10-CM | POA: Diagnosis not present

## 2015-09-15 DIAGNOSIS — R918 Other nonspecific abnormal finding of lung field: Secondary | ICD-10-CM | POA: Diagnosis present

## 2015-09-15 DIAGNOSIS — G8929 Other chronic pain: Secondary | ICD-10-CM | POA: Diagnosis present

## 2015-09-15 DIAGNOSIS — Z91048 Other nonmedicinal substance allergy status: Secondary | ICD-10-CM | POA: Diagnosis not present

## 2015-09-15 DIAGNOSIS — I441 Atrioventricular block, second degree: Secondary | ICD-10-CM | POA: Diagnosis present

## 2015-09-15 DIAGNOSIS — Z886 Allergy status to analgesic agent status: Secondary | ICD-10-CM | POA: Diagnosis not present

## 2015-09-15 DIAGNOSIS — I4892 Unspecified atrial flutter: Secondary | ICD-10-CM | POA: Diagnosis present

## 2015-09-15 DIAGNOSIS — E1142 Type 2 diabetes mellitus with diabetic polyneuropathy: Secondary | ICD-10-CM | POA: Diagnosis present

## 2015-09-15 DIAGNOSIS — R1310 Dysphagia, unspecified: Secondary | ICD-10-CM | POA: Diagnosis present

## 2015-09-15 DIAGNOSIS — R001 Bradycardia, unspecified: Secondary | ICD-10-CM | POA: Diagnosis not present

## 2015-09-15 DIAGNOSIS — I509 Heart failure, unspecified: Secondary | ICD-10-CM | POA: Diagnosis not present

## 2015-09-15 DIAGNOSIS — Z825 Family history of asthma and other chronic lower respiratory diseases: Secondary | ICD-10-CM | POA: Diagnosis not present

## 2015-09-15 DIAGNOSIS — J851 Abscess of lung with pneumonia: Secondary | ICD-10-CM | POA: Diagnosis present

## 2015-09-15 DIAGNOSIS — E1165 Type 2 diabetes mellitus with hyperglycemia: Secondary | ICD-10-CM | POA: Diagnosis present

## 2015-09-15 DIAGNOSIS — I480 Paroxysmal atrial fibrillation: Secondary | ICD-10-CM | POA: Diagnosis present

## 2015-09-15 DIAGNOSIS — J9602 Acute respiratory failure with hypercapnia: Secondary | ICD-10-CM | POA: Diagnosis present

## 2015-09-15 DIAGNOSIS — Z794 Long term (current) use of insulin: Secondary | ICD-10-CM | POA: Diagnosis not present

## 2015-09-15 DIAGNOSIS — I4891 Unspecified atrial fibrillation: Secondary | ICD-10-CM | POA: Diagnosis not present

## 2015-09-15 DIAGNOSIS — Z66 Do not resuscitate: Secondary | ICD-10-CM | POA: Diagnosis not present

## 2015-09-15 DIAGNOSIS — E872 Acidosis: Secondary | ICD-10-CM | POA: Diagnosis present

## 2015-09-15 DIAGNOSIS — G9341 Metabolic encephalopathy: Secondary | ICD-10-CM | POA: Diagnosis present

## 2015-09-15 DIAGNOSIS — E785 Hyperlipidemia, unspecified: Secondary | ICD-10-CM | POA: Diagnosis present

## 2015-09-15 DIAGNOSIS — Z951 Presence of aortocoronary bypass graft: Secondary | ICD-10-CM | POA: Diagnosis not present

## 2015-09-15 DIAGNOSIS — G4700 Insomnia, unspecified: Secondary | ICD-10-CM | POA: Diagnosis not present

## 2015-09-15 DIAGNOSIS — I5043 Acute on chronic combined systolic (congestive) and diastolic (congestive) heart failure: Secondary | ICD-10-CM | POA: Diagnosis present

## 2015-09-15 DIAGNOSIS — Z885 Allergy status to narcotic agent status: Secondary | ICD-10-CM | POA: Diagnosis not present

## 2015-09-15 DIAGNOSIS — I459 Conduction disorder, unspecified: Secondary | ICD-10-CM | POA: Diagnosis present

## 2015-09-15 DIAGNOSIS — E669 Obesity, unspecified: Secondary | ICD-10-CM | POA: Diagnosis present

## 2015-09-15 DIAGNOSIS — I482 Chronic atrial fibrillation: Secondary | ICD-10-CM | POA: Diagnosis present

## 2015-09-15 DIAGNOSIS — I5023 Acute on chronic systolic (congestive) heart failure: Secondary | ICD-10-CM | POA: Diagnosis not present

## 2015-09-15 DIAGNOSIS — D696 Thrombocytopenia, unspecified: Secondary | ICD-10-CM | POA: Diagnosis present

## 2015-09-15 DIAGNOSIS — Z7984 Long term (current) use of oral hypoglycemic drugs: Secondary | ICD-10-CM | POA: Diagnosis not present

## 2015-09-15 DIAGNOSIS — J208 Acute bronchitis due to other specified organisms: Secondary | ICD-10-CM | POA: Diagnosis not present

## 2015-09-15 DIAGNOSIS — N183 Chronic kidney disease, stage 3 (moderate): Secondary | ICD-10-CM | POA: Diagnosis present

## 2015-09-15 DIAGNOSIS — E876 Hypokalemia: Secondary | ICD-10-CM | POA: Diagnosis not present

## 2015-09-15 DIAGNOSIS — E1122 Type 2 diabetes mellitus with diabetic chronic kidney disease: Secondary | ICD-10-CM | POA: Diagnosis present

## 2015-09-15 DIAGNOSIS — I255 Ischemic cardiomyopathy: Secondary | ICD-10-CM | POA: Diagnosis present

## 2015-09-15 DIAGNOSIS — Z8249 Family history of ischemic heart disease and other diseases of the circulatory system: Secondary | ICD-10-CM | POA: Diagnosis not present

## 2015-09-15 DIAGNOSIS — I130 Hypertensive heart and chronic kidney disease with heart failure and stage 1 through stage 4 chronic kidney disease, or unspecified chronic kidney disease: Secondary | ICD-10-CM | POA: Diagnosis present

## 2015-09-15 DIAGNOSIS — J209 Acute bronchitis, unspecified: Secondary | ICD-10-CM | POA: Diagnosis present

## 2015-09-15 DIAGNOSIS — M545 Low back pain: Secondary | ICD-10-CM | POA: Diagnosis present

## 2015-09-15 DIAGNOSIS — I248 Other forms of acute ischemic heart disease: Secondary | ICD-10-CM | POA: Diagnosis present

## 2015-09-15 DIAGNOSIS — E039 Hypothyroidism, unspecified: Secondary | ICD-10-CM | POA: Diagnosis present

## 2015-09-15 DIAGNOSIS — T502X5A Adverse effect of carbonic-anhydrase inhibitors, benzothiadiazides and other diuretics, initial encounter: Secondary | ICD-10-CM | POA: Diagnosis not present

## 2015-09-15 DIAGNOSIS — Z6827 Body mass index (BMI) 27.0-27.9, adult: Secondary | ICD-10-CM | POA: Diagnosis not present

## 2015-09-15 DIAGNOSIS — I080 Rheumatic disorders of both mitral and aortic valves: Secondary | ICD-10-CM | POA: Diagnosis present

## 2015-09-15 DIAGNOSIS — N179 Acute kidney failure, unspecified: Secondary | ICD-10-CM | POA: Diagnosis not present

## 2015-09-15 LAB — BASIC METABOLIC PANEL
Anion gap: 9 (ref 5–15)
Anion gap: 11 (ref 5–15)
BUN: 37 mg/dL — ABNORMAL HIGH (ref 6–20)
BUN: 40 mg/dL — AB (ref 6–20)
CALCIUM: 9 mg/dL (ref 8.9–10.3)
CO2: 32 mmol/L (ref 22–32)
CO2: 31 mmol/L (ref 22–32)
CREATININE: 1.25 mg/dL — AB (ref 0.44–1.00)
CREATININE: 1.25 mg/dL — AB (ref 0.44–1.00)
Calcium: 8.8 mg/dL — ABNORMAL LOW (ref 8.9–10.3)
Chloride: 93 mmol/L — ABNORMAL LOW (ref 101–111)
Chloride: 92 mmol/L — ABNORMAL LOW (ref 101–111)
GFR calc Af Amer: 45 mL/min — ABNORMAL LOW (ref >60)
GFR, EST AFRICAN AMERICAN: 45 mL/min — AB (ref >60)
GFR, EST NON AFRICAN AMERICAN: 39 mL/min — AB (ref >60)
GFR, EST NON AFRICAN AMERICAN: 39 mL/min — AB (ref >60)
GLUCOSE: 126 mg/dL — AB (ref 65–99)
Glucose, Bld: 183 mg/dL — ABNORMAL HIGH (ref 65–99)
Potassium: 4.5 mmol/L (ref 3.5–5.1)
Potassium: 4.1 mmol/L (ref 3.5–5.1)
SODIUM: 134 mmol/L — AB (ref 135–145)
SODIUM: 134 mmol/L — AB (ref 135–145)

## 2015-09-15 LAB — POCT I-STAT 3, ART BLOOD GAS (G3+)
ACID-BASE EXCESS: 7 mmol/L — AB (ref 0.0–2.0)
Acid-Base Excess: 9 mmol/L — ABNORMAL HIGH (ref 0.0–2.0)
Bicarbonate: 34.4 mEq/L — ABNORMAL HIGH (ref 20.0–24.0)
Bicarbonate: 33 mEq/L — ABNORMAL HIGH (ref 20.0–24.0)
O2 Saturation: 100 %
O2 Saturation: 99 %
PH ART: 7.379 (ref 7.350–7.450)
PH ART: 7.49 — AB (ref 7.350–7.450)
TCO2: 36 mmol/L (ref 0–100)
TCO2: 34 mmol/L (ref 0–100)
pCO2 arterial: 58.2 mmHg (ref 35.0–45.0)
pCO2 arterial: 43.3 mmHg (ref 35.0–45.0)
pO2, Arterial: 430 mmHg — ABNORMAL HIGH (ref 80.0–100.0)
pO2, Arterial: 114 mmHg — ABNORMAL HIGH (ref 80.0–100.0)

## 2015-09-15 LAB — CBC
HCT: 38.2 % (ref 36.0–46.0)
HEMOGLOBIN: 11.9 g/dL — AB (ref 12.0–15.0)
MCH: 28 pg (ref 26.0–34.0)
MCHC: 31.2 g/dL (ref 30.0–36.0)
MCV: 89.9 fL (ref 78.0–100.0)
PLATELETS: 157 10*3/uL (ref 150–400)
RBC: 4.25 MIL/uL (ref 3.87–5.11)
RDW: 15.3 % (ref 11.5–15.5)
WBC: 10.7 10*3/uL — ABNORMAL HIGH (ref 4.0–10.5)

## 2015-09-15 LAB — BLOOD GAS, ARTERIAL
ACID-BASE EXCESS: 3.2 mmol/L — AB (ref 0.0–2.0)
Acid-Base Excess: 4.4 mmol/L — ABNORMAL HIGH (ref 0.0–2.0)
Acid-Base Excess: 4 mmol/L — ABNORMAL HIGH (ref 0.0–2.0)
BICARBONATE: 31.4 meq/L — AB (ref 20.0–24.0)
BICARBONATE: 30.5 meq/L — AB (ref 20.0–24.0)
Bicarbonate: 31.9 mEq/L — ABNORMAL HIGH (ref 20.0–24.0)
Delivery systems: POSITIVE
Delivery systems: POSITIVE
Drawn by: 365271
Drawn by: 365271
Drawn by: 441351
EXPIRATORY PAP: 6
Expiratory PAP: 6
FIO2: 0.4
FIO2: 0.4
INSPIRATORY PAP: 16
Inspiratory PAP: 16
O2 Content: 4 L/min
O2 SAT: 98.2 %
O2 SAT: 97.1 %
O2 Saturation: 95.6 %
PATIENT TEMPERATURE: 98.2
PATIENT TEMPERATURE: 98.2
PCO2 ART: 99 mmHg — AB (ref 35.0–45.0)
PCO2 ART: 68.1 mmHg — AB (ref 35.0–45.0)
PH ART: 7.133 — AB (ref 7.350–7.450)
PH ART: 7.239 — AB (ref 7.350–7.450)
PO2 ART: 97.2 mmHg (ref 80.0–100.0)
PO2 ART: 126 mmHg — AB (ref 80.0–100.0)
PO2 ART: 99.9 mmHg (ref 80.0–100.0)
Patient temperature: 98.2
RATE: 16 resp/min
TCO2: 35 mmol/L (ref 0–100)
TCO2: 33.8 mmol/L (ref 0–100)
TCO2: 32.6 mmol/L (ref 0–100)
pCO2 arterial: 75.9 mmHg (ref 35.0–45.0)
pH, Arterial: 7.272 — ABNORMAL LOW (ref 7.350–7.450)

## 2015-09-15 LAB — PROTIME-INR
INR: 2.36 — AB (ref 0.00–1.49)
PROTHROMBIN TIME: 25.6 s — AB (ref 11.6–15.2)

## 2015-09-15 LAB — HEMOGLOBIN A1C
HEMOGLOBIN A1C: 7.9 % — AB (ref 4.8–5.6)
MEAN PLASMA GLUCOSE: 180 mg/dL

## 2015-09-15 LAB — BRAIN NATRIURETIC PEPTIDE: B Natriuretic Peptide: 381.9 pg/mL — ABNORMAL HIGH (ref 0.0–100.0)

## 2015-09-15 LAB — GLUCOSE, CAPILLARY
GLUCOSE-CAPILLARY: 169 mg/dL — AB (ref 65–99)
GLUCOSE-CAPILLARY: 168 mg/dL — AB (ref 65–99)
GLUCOSE-CAPILLARY: 86 mg/dL (ref 65–99)
GLUCOSE-CAPILLARY: 106 mg/dL — AB (ref 65–99)
Glucose-Capillary: 196 mg/dL — ABNORMAL HIGH (ref 65–99)
Glucose-Capillary: 176 mg/dL — ABNORMAL HIGH (ref 65–99)
Glucose-Capillary: 104 mg/dL — ABNORMAL HIGH (ref 65–99)

## 2015-09-15 LAB — INFLUENZA PANEL BY PCR (TYPE A & B)
H1N1FLUPCR: NOT DETECTED
Influenza A By PCR: NEGATIVE
Influenza B By PCR: NEGATIVE

## 2015-09-15 LAB — MRSA PCR SCREENING: MRSA by PCR: NEGATIVE

## 2015-09-15 LAB — TROPONIN I
TROPONIN I: 0.06 ng/mL — AB (ref <0.031)
TROPONIN I: 0.07 ng/mL — AB (ref <0.031)
Troponin I: 0.05 ng/mL — ABNORMAL HIGH (ref <0.031)
Troponin I: 0.06 ng/mL — ABNORMAL HIGH (ref <0.031)

## 2015-09-15 LAB — MAGNESIUM
MAGNESIUM: 2 mg/dL (ref 1.7–2.4)
MAGNESIUM: 2 mg/dL (ref 1.7–2.4)

## 2015-09-15 LAB — PROCALCITONIN: PROCALCITONIN: 0.97 ng/mL

## 2015-09-15 LAB — LACTIC ACID, PLASMA
Lactic Acid, Venous: 1.8 mmol/L (ref 0.5–2.0)
Lactic Acid, Venous: 1.5 mmol/L (ref 0.5–2.0)

## 2015-09-15 LAB — PHOSPHORUS
PHOSPHORUS: 2 mg/dL — AB (ref 2.5–4.6)
Phosphorus: 4.3 mg/dL (ref 2.5–4.6)

## 2015-09-15 MED ORDER — FENTANYL CITRATE (PF) 100 MCG/2ML IJ SOLN
50.0000 ug | INTRAMUSCULAR | Status: DC | PRN
  Administered 2015-09-15 – 2015-09-17 (×7): 50 ug via INTRAVENOUS
  Filled 2015-09-15 (×5): qty 2

## 2015-09-15 MED ORDER — FENTANYL CITRATE (PF) 100 MCG/2ML IJ SOLN: 50.0000 ug | Freq: Once | INTRAMUSCULAR | Status: DC

## 2015-09-15 MED ORDER — ATROPINE SULFATE 0.1 MG/ML IJ SOLN: 1.0000 mg | Freq: Once | INTRAMUSCULAR | Status: DC

## 2015-09-15 MED ORDER — ANTISEPTIC ORAL RINSE SOLUTION (CORINZ)
7.0000 mL | Freq: Four times a day (QID) | OROMUCOSAL | Status: DC
  Administered 2015-09-16 – 2015-09-17 (×6): 7 mL via OROMUCOSAL

## 2015-09-15 MED ORDER — SIMVASTATIN 20 MG PO TABS
20.0000 mg | ORAL_TABLET | Freq: Every day | ORAL | Status: DC
  Administered 2015-09-15 – 2015-09-16 (×2): 20 mg
  Filled 2015-09-15 (×4): qty 1

## 2015-09-15 MED ORDER — PANTOPRAZOLE SODIUM 40 MG IV SOLR
40.0000 mg | Freq: Every day | INTRAVENOUS | Status: DC
  Administered 2015-09-15 – 2015-09-16 (×2): 40 mg via INTRAVENOUS
  Filled 2015-09-15 (×3): qty 40

## 2015-09-15 MED ORDER — FENTANYL CITRATE (PF) 100 MCG/2ML IJ SOLN: 50.0000 ug | INTRAMUSCULAR | Status: DC | PRN

## 2015-09-15 MED ORDER — INSULIN ASPART 100 UNIT/ML ~~LOC~~ SOLN
0.0000 [IU] | SUBCUTANEOUS | Status: DC
  Administered 2015-09-15 – 2015-09-16 (×5): 3 [IU] via SUBCUTANEOUS
  Administered 2015-09-16: 5 [IU] via SUBCUTANEOUS
  Administered 2015-09-16: 3 [IU] via SUBCUTANEOUS
  Administered 2015-09-16: 5 [IU] via SUBCUTANEOUS
  Administered 2015-09-17: 3 [IU] via SUBCUTANEOUS
  Administered 2015-09-17 (×3): 5 [IU] via SUBCUTANEOUS
  Administered 2015-09-17 (×2): 8 [IU] via SUBCUTANEOUS
  Administered 2015-09-18: 5 [IU] via SUBCUTANEOUS
  Administered 2015-09-18: 8 [IU] via SUBCUTANEOUS
  Administered 2015-09-18: 3 [IU] via SUBCUTANEOUS
  Administered 2015-09-18 (×2): 5 [IU] via SUBCUTANEOUS
  Administered 2015-09-19: 8 [IU] via SUBCUTANEOUS
  Administered 2015-09-19: 5 [IU] via SUBCUTANEOUS
  Administered 2015-09-19: 8 [IU] via SUBCUTANEOUS
  Administered 2015-09-19: 5 [IU] via SUBCUTANEOUS
  Administered 2015-09-19: 3 [IU] via SUBCUTANEOUS
  Administered 2015-09-19: 11 [IU] via SUBCUTANEOUS
  Administered 2015-09-20: 3 [IU] via SUBCUTANEOUS
  Administered 2015-09-20: 5 [IU] via SUBCUTANEOUS
  Administered 2015-09-20: 8 [IU] via SUBCUTANEOUS
  Administered 2015-09-20 (×2): 5 [IU] via SUBCUTANEOUS
  Administered 2015-09-20: 8 [IU] via SUBCUTANEOUS
  Administered 2015-09-20 – 2015-09-21 (×2): 5 [IU] via SUBCUTANEOUS
  Administered 2015-09-21 (×3): 8 [IU] via SUBCUTANEOUS

## 2015-09-15 MED ORDER — PROPOFOL 1000 MG/100ML IV EMUL: 0.0000 ug/kg/min | INTRAVENOUS | Status: DC

## 2015-09-15 MED ORDER — CHLORHEXIDINE GLUCONATE 0.12% ORAL RINSE (MEDLINE KIT)
15.0000 mL | Freq: Two times a day (BID) | OROMUCOSAL | Status: DC
  Administered 2015-09-15 – 2015-09-17 (×4): 15 mL via OROMUCOSAL

## 2015-09-15 MED ORDER — LEVOFLOXACIN IN D5W 750 MG/150ML IV SOLN
750.0000 mg | INTRAVENOUS | Status: AC
  Administered 2015-09-15 – 2015-09-21 (×4): 750 mg via INTRAVENOUS
  Filled 2015-09-15 (×9): qty 150

## 2015-09-15 MED ORDER — VITAL HIGH PROTEIN PO LIQD
1000.0000 mL | ORAL | Status: DC
  Administered 2015-09-15 – 2015-09-16 (×2): 1000 mL
  Filled 2015-09-15 (×4): qty 1000

## 2015-09-15 MED ORDER — FENTANYL CITRATE (PF) 100 MCG/2ML IJ SOLN
50.0000 ug | INTRAMUSCULAR | Status: DC | PRN
  Administered 2015-09-15 – 2015-09-17 (×4): 50 ug via INTRAVENOUS
  Filled 2015-09-15 (×7): qty 2

## 2015-09-15 MED ORDER — DIGOXIN 125 MCG PO TABS
0.1250 mg | ORAL_TABLET | Freq: Every day | ORAL | Status: DC
  Filled 2015-09-15 (×2): qty 1

## 2015-09-15 MED ORDER — FENTANYL BOLUS VIA INFUSION
25.0000 ug | INTRAVENOUS | Status: DC | PRN
Start: 2015-09-15 — End: 2015-09-15
  Filled 2015-09-15: qty 25

## 2015-09-15 MED ORDER — MIDAZOLAM HCL 2 MG/2ML IJ SOLN
INTRAMUSCULAR | Status: AC
  Administered 2015-09-15: 2 mg
  Filled 2015-09-15: qty 4

## 2015-09-15 MED ORDER — ATROPINE SULFATE 0.1 MG/ML IJ SOLN: 1.0000 mg | INTRAMUSCULAR | Status: DC | PRN

## 2015-09-15 MED ORDER — POTASSIUM CHLORIDE 20 MEQ/15ML (10%) PO SOLN
20.0000 meq | Freq: Two times a day (BID) | ORAL | Status: DC
  Administered 2015-09-15 – 2015-09-16 (×4): 20 meq
  Filled 2015-09-15 (×7): qty 15

## 2015-09-15 MED ORDER — SODIUM CHLORIDE 0.9 % IV SOLN
25.0000 ug/h | INTRAVENOUS | Status: DC
  Filled 2015-09-15: qty 50

## 2015-09-15 MED ORDER — ALLOPURINOL 300 MG PO TABS
300.0000 mg | ORAL_TABLET | Freq: Every day | ORAL | Status: DC
  Administered 2015-09-15 – 2015-09-17 (×3): 300 mg
  Filled 2015-09-15 (×4): qty 1

## 2015-09-15 MED ORDER — FENTANYL CITRATE (PF) 100 MCG/2ML IJ SOLN
100.0000 ug | Freq: Once | INTRAMUSCULAR | Status: AC
  Administered 2015-09-15: 100 ug via INTRAVENOUS

## 2015-09-15 MED ORDER — ETOMIDATE 2 MG/ML IV SOLN
0.3000 mg/kg | Freq: Once | INTRAVENOUS | Status: AC
  Administered 2015-09-15: 20 mg via INTRAVENOUS

## 2015-09-15 MED ORDER — MIDAZOLAM HCL 2 MG/2ML IJ SOLN
1.0000 mg | INTRAMUSCULAR | Status: DC | PRN
  Administered 2015-09-15 – 2015-09-17 (×5): 1 mg via INTRAVENOUS
  Filled 2015-09-15 (×5): qty 2

## 2015-09-15 MED ORDER — DEXTROSE 5 % IV SOLN
20.0000 mmol | Freq: Once | INTRAVENOUS | Status: AC
  Administered 2015-09-15: 20 mmol via INTRAVENOUS
  Filled 2015-09-15: qty 6.67

## 2015-09-15 MED ORDER — PRO-STAT SUGAR FREE PO LIQD
30.0000 mL | Freq: Four times a day (QID) | ORAL | Status: DC
  Administered 2015-09-15 – 2015-09-16 (×7): 30 mL via ORAL
  Filled 2015-09-15 (×14): qty 30

## 2015-09-15 MED ORDER — FENTANYL CITRATE (PF) 100 MCG/2ML IJ SOLN
INTRAMUSCULAR | Status: AC
  Administered 2015-09-15: 100 ug
  Filled 2015-09-15: qty 4

## 2015-09-15 MED ORDER — METOPROLOL TARTRATE 1 MG/ML IV SOLN: 2.5000 mg | INTRAVENOUS | Status: DC | PRN

## 2015-09-15 MED ORDER — LEVOTHYROXINE SODIUM 100 MCG IV SOLR
62.5000 ug | Freq: Every day | INTRAVENOUS | Status: DC
  Filled 2015-09-15: qty 5

## 2015-09-15 MED ORDER — LEVOTHYROXINE SODIUM 125 MCG PO TABS
125.0000 ug | ORAL_TABLET | Freq: Every day | ORAL | Status: DC
  Administered 2015-09-15 – 2015-09-17 (×3): 125 ug
  Filled 2015-09-15 (×5): qty 1

## 2015-09-15 MED ORDER — FUROSEMIDE 10 MG/ML IJ SOLN
80.0000 mg | Freq: Three times a day (TID) | INTRAMUSCULAR | Status: DC
  Administered 2015-09-15 – 2015-09-16 (×4): 80 mg via INTRAVENOUS
  Filled 2015-09-15 (×6): qty 8

## 2015-09-15 MED ORDER — COLCHICINE 0.6 MG PO TABS
0.6000 mg | ORAL_TABLET | Freq: Every day | ORAL | Status: DC
  Administered 2015-09-15 – 2015-09-17 (×3): 0.6 mg
  Filled 2015-09-15 (×4): qty 1

## 2015-09-15 MED ORDER — MIDAZOLAM HCL 2 MG/2ML IJ SOLN
1.0000 mg | INTRAMUSCULAR | Status: AC | PRN
  Administered 2015-09-15 – 2015-09-16 (×3): 1 mg via INTRAVENOUS
  Filled 2015-09-15 (×3): qty 2

## 2015-09-15 NOTE — Progress Notes (Signed)
Patient had episodes of sustained low heart rates in the 30s with afib as underline rhythm. Patient does not appear symptomatic as her B/P remains stable. MD call to bed sight. See MD note for orders and intervention.   09/15/15 0900 09/15/15 0915 09/15/15 0930  Vitals  BP (!) 130/49 mmHg (!) 139/55 mmHg (!) 129/49 mmHg  MAP (mmHg) 74 79 72  Pulse Rate (!) 37 (!) 43 (!) 37  ECG Heart Rate (!) 42 (!) 46 (!) 45  Resp (!) 0 (!) 8 (!) 0  Oxygen Therapy  SpO2 100 % 100 % 100 %     09/15/15 0945  Vitals  BP (!) 129/48 mmHg  MAP (mmHg) 74  Pulse Rate (!) 52  ECG Heart Rate (!) 45  Resp (!) 9  Oxygen Therapy  SpO2 100 %

## 2015-09-15 NOTE — Progress Notes (Signed)
Patient was noted to be bradycardic varying in 30-70s this morning approximately 1-2 hours after endotracheal intubation. Blood pressure was maintained in the 70s and rhythm showing atrial fibrillation for which was was on coumadin PTA. Repeat EKG obtained showing 5:1 flutter rhythm in lead II otherwise consistent with Afib. Comparison to old EKG redemonstrates existing left and/or variable conduction defect.  Repeat CXR, troponin, EKG, lactic acid, ABG, Bmet, Mag, phosphorus.  Pacer pads and atropine were brought to bedside during reassessment with this newly worsened bradycardia. No new treatment administered as she is not symptomatic or hypotensive at this rate. Will follow up closely and cardiology consulted for conduction abnormality recommendations.  STAFF NOTE: I, Rory Percyaniel Feinstein, MD FACP have personally reviewed patient's available data, including medical history, events of note, physical examination and test results as part of my evaluation. I have discussed with resident/NP and other care providers such as pharmacist, RN and RRT. In addition, I personally evaluated patient and elicited key findings of: her history supports infection start, viral?, does have lesion LLL that will need future work up, this am asymptomatic brady in setting known fib, sys BP maintained, etiology mucous plug?, underlying conduction dz with electrolyte and metabolic stress,ischemia?, will call cards, aply pacer pads to chest, atropine at bedside, assess lactic, stat pcxr, will add empiric levofloxacin as CT chest without infiltrate and bronchitis concerns source, add monotherapy levofloxacin for now, if drops BP or fevers add vanc / ceftriaxone, i updated son in full extensive, we finalized code status also The patient is critically ill with multiple organ systems failure and requires high complexity decision making for assessment and support, frequent evaluation and titration of therapies, application of advanced  monitoring technologies and extensive interpretation of multiple databases.   Critical Care Time devoted to patient care services described in this note is 50  Minutes. This time reflects time of care of this signee: Rory Percyaniel Feinstein, MD FACP. This critical care time does not reflect procedure time, or teaching time or supervisory time of PA/NP/Med student/Med Resident etc but could involve care discussion time. Rest per NP/medical resident whose note is outlined above and that I agree with   [name] J. Tyson AliasFeinstein, MD, FACP Pgr: [phone] [facility] 09/15/2015 4:46 PM

## 2015-09-15 NOTE — Progress Notes (Signed)
Transported on BIPAP from 3W22 to 2M01. No complications noted.

## 2015-09-15 NOTE — Progress Notes (Signed)
Initial Nutrition Assessment  DOCUMENTATION CODES:   Obesity unspecified  INTERVENTION:    Initiate TF via OGT with Vital High Protein at 25 ml/h and Prostat 30 ml QID on day 1; on day 2, increase to goal rate of 40 ml/h (960 ml per day) to provide 1360 kcals, 144 gm protein, 803 ml free water daily.  NUTRITION DIAGNOSIS:   Inadequate oral intake related to inability to eat as evidenced by NPO status.  GOAL:   Provide needs based on ASPEN/SCCM guidelines  MONITOR:   Vent status, Labs, Weight trends, TF tolerance, I & O's  REASON FOR ASSESSMENT:   Consult Enteral/tube feeding initiation and management  ASSESSMENT:   82 year old never smoker with CHF, admitted 1/5 with cough and sinusitis and respiratory distress, transferred to ICU for increased obtundation and BiPAP. Required intubation on 1/6.  Labs reviewed: sodium and phosphorus are low. Discussed patient in ICU rounds and with RN today. Received MD Consult for TF initiation and management. Nutrition focused physical exam completed.  No muscle or subcutaneous fat depletion noticed.  Patient is currently intubated on ventilator support Temp (24hrs), Avg:98.5 F (36.9 C), Min:98.2 F (36.8 C), Max:99 F (37.2 C)   Diet Order:  Diet NPO time specified  Skin:  Reviewed, no issues  Last BM:  1/3  Height:   Ht Readings from Last 1 Encounters:  09/14/15 5\' 9"  (1.753 m)    Weight:   Wt Readings from Last 1 Encounters:  09/15/15 210 lb 8.6 oz (95.5 kg)   Admit weight 221 lb 1.6 oz (100.29 kg)  Ideal Body Weight:  65.9 kg  BMI:  Body mass index is 31.08 kg/(m^2).  Estimated Nutritional Needs:   Kcal:  [id number]  Protein:  >/= 132 gm  Fluid:  >/= 1.5 L  EDUCATION NEEDS:   No education needs identified at this time  Joaquin CourtsKimberly Harris, RD, LDN, CNSC Pager [phone] After Hours Pager [phone]

## 2015-09-15 NOTE — Progress Notes (Signed)
Shift event: RRRN, April, paged this NP because of being called to pt's room for unresponsiveness. Per April, the pt moans and opens eyes to sternal rub, but is presently non verbal. ABG showed low pH and high PCO2. Bipap was applied but pt began to have respiratory difficulties. At that point, pt transferred emergently to ICU with anticipation of intubation. However, when PCCM sent PA-C to see pt, she awoke and was verbal. Decision now is to leave pt on Bipap and recheck ABG later. Dr. Toniann FailKakrakandy of [facility] was called to go see pt because [facility]  KJKG, NP [facility]

## 2015-09-15 NOTE — Progress Notes (Signed)
NT at bedside bathing patient, and requesting assistance to turn patient, stating patient "is not waking up enough to turn."  Pt appears lethargic and increased work of breathing noted, using accessory muscles and "snoring."  Pt's skin feels warm, moist and clammy, and NT reports that her sheets were wet from perspiration before bathing her.  VSS.  CBG 196.  Pt difficult to arouse but briefly opens her eyes to voice and is able to answer questions appropriately, however is unable to follow commands appropriately d/t lethargy.  Lung sounds difficult to assess d/t pt's noisy mouth breathing, but no rales or crackles noted.  Rapid response nurse notified and states she will come evaluate patient.  Will continue to monitor.  Alonza Bogusuvall, Jovanni Eckhart Gray

## 2015-09-15 NOTE — Significant Event (Signed)
Rapid Response Event Note Called to see pt for AMS  Overview: Time Called: 0254 Arrival Time: 0257 Event Type: Respiratory  Initial Focused Assessment: On assessment the pt will open her eyes to deep sternal rub, she will open her eyes, make a noise, then goes right back to sleep.  RR 28 with accessory muscle use.  Sats 94% 4L BP 150/100. ABG obtained and pt placed on bipap while waiting for results.  ABG 7.1/99/90/  Interventions:  ABG Event Summary: Eula ListenK. Kirby-Graham, NP   at   0330         Larence PenningPugh, Dena Esperanza Hedgecock

## 2015-09-15 NOTE — Progress Notes (Signed)
ANTICOAGULATION CONSULT NOTE - Initial Consult  Pharmacy Consult for heparin (holding warfarin) Indication: atrial fibrillation  Allergies  Allergen Reactions  . Adhesive [Tape]     Skin turns red and itching   . Codeine     Stop breathing   . Oxycodone     unknown    Patient Measurements: Height: 5\' 9"  (175.3 cm) Weight: 210 lb 8.6 oz (95.5 kg) IBW/kg (Calculated) : 66.2 Heparin Dosing Weight: 86.54 kg  Vital Signs: Temp: 99 F (37.2 C) (01/06 1115) Temp Source: Oral (01/06 1115) BP: 114/59 mmHg (01/06 1300) Pulse Rate: 67 (01/06 1300)  Labs:  Recent Labs  09/14/15 0108 09/15/15 0730 09/15/15 1045  HGB 13.6 11.9*  --   HCT 42.6 38.2  --   PLT 149* 157  --   APTT 36  --   --   LABPROT 20.5* 25.6*  --   INR 1.76* 2.36*  --   CREATININE 0.91 1.25* 1.25*  TROPONINI 0.03 0.05* 0.06*    Estimated Creatinine Clearance: 41.9 mL/min (by C-G formula based on Cr of 1.25).   Medical History: Past Medical History  Diagnosis Date  . CHF (congestive heart failure) (HCC)   . Anemia   . Mitral valve disorder   . Atrial fibrillation (HCC)   . Pure hypercholesterolemia   . Proteinuria    Assessment: 82 yo F admitted 09/14/2015 with shortness of breath and respiratory distress. Patient on warfarin PTA for Afib. Currently holding warfarin, INR this morning 2.36. H/H stable, Plt wnl. No s/sx of bleeding noted  Pharmacy consulted to start heparin once INR <2.   Goal of Therapy:  Heparin level 0.3-0.7 units/ml Monitor platelets by anticoagulation protocol: Yes   Plan:  - Continue to hold warfarin - Will not start heparin yet - Monitor daily INR to be < 2 to begin heparin - Monitor daily CBC and s/sx of bleeding  Casilda Carlsaylor Raha Tennison, PharmD. PGY-1 Pharmacy Resident Pager: [phone]  09/15/2015,2:00 PM

## 2015-09-15 NOTE — Progress Notes (Signed)
ANTICOAGULATION CONSULT NOTE - Initial Consult  Pharmacy Consult for Heparin (holding warfarin) Indication: atrial fibrillation  Allergies  Allergen Reactions  . Adhesive [Tape]     Skin turns red and itching   . Codeine     Stop breathing   . Oxycodone     unknown    Patient Measurements: Height: 5\' 9"  (175.3 cm) Weight: 210 lb 8.6 oz (95.5 kg) IBW/kg (Calculated) : 66.2  Vital Signs: Temp: 98.2 F (36.8 C) (01/06 0228) Temp Source: Oral (01/06 0228) BP: 104/51 mmHg (01/06 0500) Pulse Rate: 70 (01/06 0500)  Labs:  Recent Labs  09/14/15 0108  HGB 13.6  HCT 42.6  PLT 149*  APTT 36  LABPROT 20.5*  INR 1.76*  CREATININE 0.91  TROPONINI 0.03   Estimated Creatinine Clearance: 57.6 mL/min (by C-G formula based on Cr of 0.91).  Medical History: Past Medical History  Diagnosis Date  . CHF (congestive heart failure) (HCC)   . Anemia   . Mitral valve disorder   . Atrial fibrillation (HCC)   . Pure hypercholesterolemia   . Proteinuria    Assessment: 82 y/o F to start heparin while holding warfarin, received warfarin dose last night, INR hasn't been drawn yet this AM  Goal of Therapy:  Heparin level 0.3-0.7 units/ml Monitor platelets by anticoagulation protocol: Yes   Plan:  -Await INR with AM labs -Start heparin drip at 850 units/hr if INR is <2  Lori Harrington, Lori Harrington 09/15/2015,5:55 AM

## 2015-09-15 NOTE — Progress Notes (Signed)
Patient ID: Lori Harrington, female   DOB: 11-Sep-1929, 82 y.o.   MRN: [id number] I have had extensive discussions with family son. We discussed patients current circumstances and organ failures. We also discussed patient's prior wishes under circumstances such as this. Family has decided to NOT perform resuscitation if arrest but to continue current medical support for now.  [name] J. Tyson AliasFeinstein, MD, FACP Pgr: [phone] [facility]

## 2015-09-15 NOTE — Consult Note (Signed)
CARDIOLOGY CONSULT NOTE   Patient ID: Lori Harrington MRN: [id number], DOB/AGE: Nov 21, 1929   Admit date: 09/14/2015 Date of Consult: 09/15/2015 Reason for Consult: Bradycardia   Primary Physician: No primary care provider on file. Primary Cardiologist: Dr. Darrold Junker  HPI: Lori Harrington is a 82 y.o. female with past medical history of CAD (s/p CABG 2012 - performed at [facility]), mitral regurgitation (s/p MVR in 2012) ischemic cardiomyopathy (EF 45% by echo in 03/2014), paroxysmal atrial fibrillation (on Coumadin), HTN, HLD, Type 2 DM who presented to [facility] ED on 09/14/2015 for worsening shortness of breath starting the previous day.   She was noted to be hypoxic in the 80's by EMS. She was placed on CPAP with improvement in her saturations. Her HR was in the 60's - 80's while in the ED. She reported having been seen by her PCP the day prior and diagnosed with bronchitis at that time. She was started on Levaquin.  Later that night and in the early morning hours of 09/14/2014, she became lethargic and was unable to follow commands. She was then intubated due to her acute respiratory distress.   This morning, 1-2 hours following intubation, she was noted to have a varying HR in the 30's - 70's. EKG at that time showed atrial flutter with 5: 1 conduction and TWI in anterior and lateral leads, HR of 51.  Her HR remained in the 30's - 40's for approximately one hour. Since 1000, her HR has been relatively stable in the 50's - 70's. She has remained in atrial fibrillation and has occasional pauses up to 1.6 seconds due to delayed conduction.   She remains intubated at the time of this encounter. She can shake her head yes/no to questions but mainly focuses on wanting to remove her tube.   Significant labs this admission include: BNP elevated to 746. Troponin values of 0.03, 0.05, and 0.06 thus far. Lactic Acid of 2.13. Dig Level of 0.7. Repeat echo showed EF of 40-45% with PA Peak pressure of  45.  Was last seen by Dr. Darrold Junker in 05/2015 and was having chronic exertional dyspnea at that time but denied chest pain. HR was 70 at the time of that visit. Cardiac medications at that time included Digoxin 0.125 MG daily, Lasix 40mg  in AM, 20mg  in PM, Lisinopril 10mg  daily, Lopressor 50mg  BID, Simvastatin 20mg  daily, and her Coumadin for anticoagulation.  Problem List Past Medical History  Diagnosis Date  . CHF (congestive heart failure) (HCC)   . Anemia   . Mitral valve disorder   . Atrial fibrillation (HCC)   . Pure hypercholesterolemia   . Proteinuria     Past Surgical History  Procedure Laterality Date  . Abdominal hysterectomy    . Cardiac surgery    . Total knee arthroplasty Left      Allergies Allergies  Allergen Reactions  . Adhesive [Tape]     Skin turns red and itching   . Codeine     Stop breathing   . Oxycodone     unknown      Inpatient Medications . allopurinol  300 mg Per Tube Daily  . colchicine  0.6 mg Per Tube Daily  . digoxin  0.125 mg Per Tube Daily  . feeding supplement (VITAL HIGH PROTEIN)  1,000 mL Per Tube Q24H  . furosemide  80 mg Intravenous 3 times per day  . hydrALAZINE  10 mg Intravenous Once  . insulin aspart  0-15 Units Subcutaneous 6 times per  day  . levofloxacin (LEVAQUIN) IV  750 mg Intravenous Q48H  . levothyroxine  125 mcg Per Tube QAC breakfast  . pantoprazole (PROTONIX) IV  40 mg Intravenous Daily  . potassium chloride  20 mEq Per Tube BID  . simvastatin  20 mg Per Tube QHS  . sodium chloride  3 mL Intravenous Q12H  . sodium phosphate  Dextrose 5% IVPB  20 mmol Intravenous Once    Family History Family History  Problem Relation Age of Onset  . Hypertension Mother   . CAD Father   . Diabetes Brother   . Asthma Mother      Social History Social History   Social History  . Marital Status: Widowed    Spouse Name: N/A  . Number of Children: N/A  . Years of Education: N/A   Occupational History  . Not on file.    Social History Main Topics  . Smoking status: Never Smoker   . Smokeless tobacco: Not on file  . Alcohol Use: No  . Drug Use: No  . Sexual Activity: Not on file   Other Topics Concern  . Not on file   Social History Narrative  . No narrative on file     Review of Systems: Unable to be obtained secondary to patient's current intubation.  Physical Exam Blood pressure 114/59, pulse 67, temperature 99 F (37.2 C), temperature source Oral, resp. rate 16, height 5\' 9"  (1.753 m), weight 210 lb 8.6 oz (95.5 kg), SpO2 99 %.  General: Pleasant, Caucasian female appearing in NAD. Currently awake while intubated.  Psych: Normal affect. Neuro: Alert and oriented X 3. Moves all extremities spontaneously. HEENT: Normal  Neck: Supple without bruits or JVD. Lungs:  Resp regular and unlabored, Decreased breath sounds at bases. No wheezing appreciated. Currently intubated. Heart: RRR no s3, s4, 2/6 SEM at Apex. Abdomen: Soft, non-tender, non-distended, BS + x 4.  Extremities: No clubbing or cyanosis. Trace edema bilaterally. DP/PT/Radials 2+ and equal bilaterally.  Labs   Recent Labs  09/14/15 0108 09/15/15 0730 09/15/15 1045  TROPONINI 0.03 0.05* 0.06*   Lab Results  Component Value Date   WBC 10.7* 09/15/2015   HGB 11.9* 09/15/2015   HCT 38.2 09/15/2015   MCV 89.9 09/15/2015   PLT 157 09/15/2015    Recent Labs Lab 09/15/15 1045  NA 134*  K 4.1  CL 92*  CO2 31  BUN 40*  CREATININE 1.25*  CALCIUM 8.8*  GLUCOSE 126*    Radiology/Studies  Ct Angio Chest Pe W/cm &/or Wo Cm: 09/14/2015  CLINICAL DATA:  Hypoxia. History of congestive heart failure and atrial fibrillation EXAM: CT ANGIOGRAPHY CHEST WITH CONTRAST TECHNIQUE: Multidetector CT imaging of the chest was performed using the standard protocol during bolus administration of intravenous contrast. Multiplanar CT image reconstructions and MIPs were obtained to evaluate the vascular anatomy. CONTRAST:  85mL OMNIPAQUE  IOHEXOL 350 MG/ML SOLN COMPARISON:  Chest x-ray 09/13/2013 FINDINGS: Negative for pulmonary embolism. Mild pulmonary artery enlargement likely due to COPD. Prior CABG. Coronary calcification. Mild cardiac enlargement. Ascending aorta mildly dilated at 43 mm. Descending thoracic aorta atherosclerotic without aneurysm. No pericardial effusion Left lower lobe mass lesion measures 24 x 30 mm. There is central fluid density with surrounding enhancing soft tissue density. Two areas of sub solid density right upper lobe anteriorly measuring approximately 1 cm each. Right lower lobe 8 mm subpleural nodule axial image 131 series 506. Negative for pleural effusion. Precarinal lymph node measures 17 x 22 mm. No mediastinal  mass. No hilar adenopathy. Upper abdomen reveals no acute abnormality. No acute skeletal abnormality. Review of the MIP images confirms the above findings. IMPRESSION: Negative for pulmonary embolism. Left lower lobe mass lesion with central fluid density. This may represent carcinoma of the lung. Lung abscess, pneumonia, and other benign etiologies also possible. Three lung nodules on the right as above. Differential includes malignancy and infection and scarring. Close follow-up recommended. Electronically Signed   By: Marlan Palauharles  Clark M.D.   On: 09/14/2015 10:51   Portable Chest Xray: 09/15/2015  CLINICAL DATA:  Ventilator dependent respiratory failure. EXAM: PORTABLE CHEST 1 VIEW COMPARISON:  Earlier same day FINDINGS: Endotracheal tube has its tip 3 cm above the carina. Nasogastric tube enters the abdomen. Left lower lobe pneumonia/ collapse persists, with collapse being more complete. Right lung well aerated. IMPRESSION: Endotracheal tube and nasogastric tube well positioned. Left lower lobe pneumonia/collapse persists, with collapse being more complete. Electronically Signed   By: Paulina FusiMark  Shogry M.D.   On: 09/15/2015 08:11   Dg Chest Port 1 View: 09/15/2015  CLINICAL DATA:  Acute onset of shortness of  breath. Initial encounter. EXAM: PORTABLE CHEST 1 VIEW COMPARISON:  Chest radiograph and CTA of the chest from 09/13/2014 FINDINGS: The lungs are well-aerated. Left basilar airspace opacity may reflect pneumonia or asymmetric pulmonary edema. Underlying vascular congestion is noted. A small left pleural effusion is noted. No pneumothorax is seen. The right costophrenic angle is incompletely imaged on this study. The cardiomediastinal silhouette is is mildly enlarged. The patient is status post CABG. No acute osseous abnormalities are seen. IMPRESSION: Left basilar airspace opacity may reflect pneumonia or asymmetric pulmonary edema. Vascular congestion and mild cardiomegaly. Small left pleural effusion noted. Electronically Signed   By: Roanna RaiderJeffery  Chang M.D.   On: 09/15/2015 04:44   Dg Chest Port 1 View: 09/14/2015  CLINICAL DATA:  Acute onset of respiratory distress and shortness of breath. Initial encounter. EXAM: PORTABLE CHEST 1 VIEW COMPARISON:  None. FINDINGS: The lungs are well-aerated. Vascular congestion is noted. Mildly increased interstitial markings may reflect mild interstitial edema. No definite pleural effusion or pneumothorax is seen. The cardiomediastinal silhouette is mildly enlarged. The patient is status post CABG. No acute osseous abnormalities are seen. IMPRESSION: Vascular congestion and mild cardiomegaly. Mildly increased interstitial markings may reflect mild interstitial edema. Electronically Signed   By: Roanna RaiderJeffery  Chang M.D.   On: 09/14/2015 01:59    ECG: Atrial flutter with 5: 1 conduction and TWI in anterior and lateral leads. HR 51.   ECHOCARDIOGRAM: 09/14/2015  Study Conclusions - Left ventricle: Inferior and septal hypokinesis. The cavity size was mildly dilated. Wall thickness was normal. Systolic function was mildly to moderately reduced. The estimated ejection fraction was in the range of 40% to 45%. - Aortic valve: There was mild stenosis. There was  trivial regurgitation. Valve area (VTI): 2.51 cm^2. Valve area (Vmax): 2.55 cm^2. Valve area (Vmean): 2.46 cm^2. - Mitral valve: ? Previous MV repair with trivial residual MR. Valve area by continuity equation (using LVOT flow): 2.21 cm^2. - Left atrium: The atrium was moderately dilated. - Right ventricle: The cavity size was mildly dilated. - Right atrium: The atrium was mildly to moderately dilated. - Atrial septum: No defect or patent foramen ovale was identified. - Pulmonary arteries: PA peak pressure: 45 mm Hg (S).  ASSESSMENT AND PLAN  1. Bradycardia following intubation - in reviewing notes in Care Everywhere, no history of bradycardia is mentioned.  - appears her HR was stable in the ED,  but declined into the 30's - 40's, a few hours following intubation - since 1000, her HR has been mostly stable in the 50's - 70's.  - continue to monitor. Agree with holding BB at this time in case of future recurrence.  2. Acute Hypoxic Respiratory Failure - currently intubated. - CXR shows left lower lobe PNA. Currently on Levaquin.  - CTA negative for PE. Left lower lobe mass was identified, concerning for carcinoma vs. Abscess vs. PNA. Will need to be followed. Three lung nodules were also identified. - BNP elevated to 746 on admission. Started on Lasix 80mg  TID.  - per admitting team  3. History of CAD - s/p CABG in 2012 - performed at [facility] - according to PCP notes, she "failed CABG" but this is not listed in her Cardiology office notes.  - no cath reports are available for review in Care Everywhere. - troponin minimally elevated likely secondary to demand ischemia. EKG does show TWI in anterior and lateral leads. In reviewed notes, it appears she did not present with any chest pain. Will need to be further addressed once her respiratory status improves.  4. Paroxysmal Atrial Fibrillation/Flutter This patients CHA2DS2-VASc Score and unadjusted Ischemic Stroke Rate (% per year) is  equal to 11.2 % stroke rate/year from a score of 7 (CHF, HTN, DM, Vascular, Female, Age > 75 (2)). Was on Coumadin PTA. Currently on Heparin following intubation. - Continue Digoxin per tube. No BB secondary to bradycardia. Dig level already checked and at 0.7.  5. Ischemic Cardiomyopathy - EF 45% by echo in 03/2014. Repeat echo this admission shows EF of 40-45% with no  - refer to #3   Signed, Ellsworth Lennox, PA-C 09/15/2015, 2:08 PM Pager: [phone]  I have examined the patient and reviewed assessment and plan and discussed with patient.  Agree with above as stated.  Stop metoprolol due to bradycardia.  Digoxin may also contribute.  HR better now.  May have been related to the intubation meds.  Will follow off rate slowing drugs.    Delvis Kau S.

## 2015-09-15 NOTE — Procedures (Signed)
Intubation Procedure Note Lori RailJoyce A Harrington [id number] [birth year]  Procedure: Intubation Indications: Respiratory insufficiency  Procedure Details Consent: Unable to obtain consent because of altered level of consciousness. Time Out: Verified patient identification, verified procedure, site/side was marked, verified correct patient position, special equipment/implants available, medications/allergies/relevent history reviewed, required imaging and test results available.  Performed  Maximum sterile technique was used including gloves, hand hygiene and mask.  MAC and 3  Versed 1 mg Fentanyl 100 mcg Etomidate 20    Evaluation Hemodynamic Status: BP stable throughout; O2 sats: stable throughout Patient's Current Condition: stable Complications: No apparent complications Patient did tolerate procedure well. Chest X-ray ordered to verify placement.  CXR: pending.   ALVA,RAKESH V. 09/15/2015

## 2015-09-15 NOTE — H&P (Signed)
PULMONARY / CRITICAL CARE MEDICINE   Name: Lori Harrington MRN: [id number] DOB: 08-12-1930    ADMISSION DATE:  09/14/2015 CONSULTATION DATE: 09/15/15  REFERRING MD : Dr. Konrad Dolores  PRIMARY SERVICE: Critical Care   CHIEF COMPLAINT:  SOB  HISTORY OF PRESENT ILLNESS:  Patient is on Bipap. As such, history obtained from hospitalist note.  Patient is a 82 yo F with a PMHx of CHF, Afib, HLD who presented to the hospital yesterday for acute onset SOB; no CP. She was hypoxic in the 80s when EMS arrived and was placed on cpap. She was prescribed Levaquin for cough and sinusitis by her PCP the day prior to admission. Patient was thought to have acute hypoxic respiratory failure secondary to heart failure (EF 45% in 03/2014). CXR showing mild vascular congestion and BNP in 700s. CTA was negative for PR but showed a LLL mass lesion. She was placed on Bipap and diuresed with IV Lasix.   PAST MEDICAL HISTORY :  Past Medical History  Diagnosis Date  . CHF (congestive heart failure) (HCC)   . Anemia   . Mitral valve disorder   . Atrial fibrillation (HCC)   . Pure hypercholesterolemia   . Proteinuria    Past Surgical History  Procedure Laterality Date  . Abdominal hysterectomy    . Cardiac surgery    . Total knee arthroplasty Left    Prior to Admission medications   Medication Sig Start Date End Date Taking? Authorizing Provider  allopurinol (ZYLOPRIM) 300 MG tablet Take 300 mg by mouth daily.   Yes Historical Provider, MD  Cholecalciferol (VITAMIN D-3) 1000 units CAPS Take 1 capsule by mouth daily.   Yes Historical Provider, MD  colchicine 0.6 MG tablet Take 0.6 mg by mouth daily as needed. For gout   Yes Historical Provider, MD  digoxin (LANOXIN) 0.125 MG tablet Take 0.125 mg by mouth daily.    Yes Historical Provider, MD  docusate sodium (COLACE) 100 MG capsule Take 100 mg by mouth daily.    Yes Historical Provider, MD  furosemide (LASIX) 20 MG tablet Take 20 mg by mouth See admin instructions.  Son states she takes bid then the next day she will take tid   Yes Historical Provider, MD  gabapentin (NEURONTIN) 300 MG capsule Take 300 mg by mouth 3 (three) times daily.   Yes Historical Provider, MD  insulin glargine (LANTUS) 100 UNIT/ML injection Inject 40-44 Units into the skin at bedtime.   Yes Historical Provider, MD  Insulin Lispro (HUMALOG KWIKPEN [place]) Inject 20-30 units of lipase into the skin See admin instructions. Take 30 units at breakfast, 20 units at lunch, 28 units at supper per sliding scale   Yes Historical Provider, MD  levofloxacin (LEVAQUIN) 500 MG tablet Take 500 mg by mouth daily. Take for 10 days   Yes Historical Provider, MD  levothyroxine (SYNTHROID, LEVOTHROID) 125 MCG tablet Take 125 mcg by mouth daily before breakfast.   Yes Historical Provider, MD  lisinopril (PRINIVIL,ZESTRIL) 10 MG tablet Take 10 mg by mouth daily.   Yes Historical Provider, MD  metFORMIN (GLUCOPHAGE) 500 MG tablet Take 500 mg by mouth 2 (two) times daily with a meal.   Yes Historical Provider, MD  metoprolol (LOPRESSOR) 50 MG tablet Take 50 mg by mouth 2 (two) times daily.   Yes Historical Provider, MD  ondansetron (ZOFRAN) 4 MG tablet Take 4 mg by mouth every 8 (eight) hours as needed for nausea or vomiting.   Yes Historical Provider, MD  potassium  chloride SA (K-DUR,KLOR-CON) 20 MEQ tablet Take 20 mEq by mouth 2 (two) times daily.   Yes Historical Provider, MD  simvastatin (ZOCOR) 20 MG tablet Take 20 mg by mouth at bedtime.   Yes Historical Provider, MD  traMADol (ULTRAM) 50 MG tablet Take 50 mg by mouth 3 (three) times daily.   Yes Historical Provider, MD  vitamin B-12 (CYANOCOBALAMIN) 1000 MCG tablet Take 1,000 mcg by mouth daily.   Yes Historical Provider, MD  warfarin (COUMADIN) 4 MG tablet Take 4 mg by mouth daily.   Yes Historical Provider, MD   Allergies  Allergen Reactions  . Adhesive [Tape]     Skin turns red and itching   . Codeine     Stop breathing   . Oxycodone     unknown     FAMILY HISTORY:  Family History  Problem Relation Age of Onset  . Hypertension Mother   . CAD Father   . Diabetes Brother   . Asthma Mother    SOCIAL HISTORY:  reports that she has never smoked. She does not have any smokeless tobacco history on file. She reports that she does not drink alcohol or use illicit drugs.  REVIEW OF SYSTEMS: Could not be done as patient is on Bipap.   SUBJECTIVE:   VITAL SIGNS: Temp:  [98.2 F (36.8 C)-98.6 F (37 C)] 98.2 F (36.8 C) (01/06 0228) Pulse Rate:  [43-108] 84 (01/06 0322) Resp:  [12-29] 21 (01/06 0322) BP: (127-170)/(59-104) 152/104 mmHg (01/06 0228) SpO2:  [93 %-100 %] 99 % (01/06 0322) FiO2 (%):  [30 %] 30 % (01/05 1306) Weight:  [100.29 kg (221 lb 1.6 oz)] 100.29 kg (221 lb 1.6 oz) (01/05 1306) HEMODYNAMICS:   VENTILATOR SETTINGS: Vent Mode:  [-]  FiO2 (%):  [30 %] 30 % INTAKE / OUTPUT: Intake/Output      01/05 0701 - 01/06 0700   P.O. 100   Total Intake(mL/kg) 100 (1)   Urine (mL/kg/hr) 2275 (0.9)   Total Output 2275   Net -2175         PHYSICAL EXAMINATION: General: Obtunded  Neuro: Somnolent  HEENT: Normocephalic and atraumatic.  Cardiovascular: RRR. S1 and S2 appreciated. Lungs: Anterior lung fields clear to auscultation.  Abdomen: Soft. +BS. Non-distended, no rigidity.  Musculoskeletal: +2 edema of bilateral lower extremities  Skin: Warm and dry  LABS:  CBC  Recent Labs Lab 09/14/15 0108  WBC 9.3  HGB 13.6  HCT 42.6  PLT 149*   Coag's  Recent Labs Lab 09/14/15 0108  APTT 36  INR 1.76*   BMET  Recent Labs Lab 09/14/15 0108  NA 130*  K 4.9  CL 94*  CO2 24  BUN 21*  CREATININE 0.91  GLUCOSE 199*   Electrolytes  Recent Labs Lab 09/14/15 0108  CALCIUM 9.3   Sepsis Markers  Recent Labs Lab 09/14/15 0115 09/14/15 1146 09/14/15 1420  LATICACIDVEN 2.13* 1.5 1.4   ABG  Recent Labs Lab 09/14/15 1226 09/15/15 0320 09/15/15 0355  PHART 7.299* 7.133* 7.239*  PCO2ART  58.7* 99.0* 75.9*  PO2ART 80.0 97.2 126*   Liver Enzymes No results for input(s): AST, ALT, ALKPHOS, BILITOT, ALBUMIN in the last 168 hours. Cardiac Enzymes  Recent Labs Lab 09/14/15 0108  TROPONINI 0.03   Glucose  Recent Labs Lab 09/14/15 1628 09/14/15 1936 09/15/15 0311  GLUCAP 151* 151* 196*    Imaging Ct Angio Chest Pe W/cm &/or Wo Cm  09/14/2015  CLINICAL DATA:  Hypoxia. History of congestive heart failure  and atrial fibrillation EXAM: CT ANGIOGRAPHY CHEST WITH CONTRAST TECHNIQUE: Multidetector CT imaging of the chest was performed using the standard protocol during bolus administration of intravenous contrast. Multiplanar CT image reconstructions and MIPs were obtained to evaluate the vascular anatomy. CONTRAST:  85mL OMNIPAQUE IOHEXOL 350 MG/ML SOLN COMPARISON:  Chest x-ray 09/13/2013 FINDINGS: Negative for pulmonary embolism. Mild pulmonary artery enlargement likely due to COPD. Prior CABG. Coronary calcification. Mild cardiac enlargement. Ascending aorta mildly dilated at 43 mm. Descending thoracic aorta atherosclerotic without aneurysm. No pericardial effusion Left lower lobe mass lesion measures 24 x 30 mm. There is central fluid density with surrounding enhancing soft tissue density. Two areas of sub solid density right upper lobe anteriorly measuring approximately 1 cm each. Right lower lobe 8 mm subpleural nodule axial image 131 series 506. Negative for pleural effusion. Precarinal lymph node measures 17 x 22 mm. No mediastinal mass. No hilar adenopathy. Upper abdomen reveals no acute abnormality. No acute skeletal abnormality. Review of the MIP images confirms the above findings. IMPRESSION: Negative for pulmonary embolism. Left lower lobe mass lesion with central fluid density. This may represent carcinoma of the lung. Lung abscess, pneumonia, and other benign etiologies also possible. Three lung nodules on the right as above. Differential includes malignancy and infection and  scarring. Close follow-up recommended. Electronically Signed   By: Marlan Palau M.D.   On: 09/14/2015 10:51   Dg Chest Port 1 View  09/14/2015  CLINICAL DATA:  Acute onset of respiratory distress and shortness of breath. Initial encounter. EXAM: PORTABLE CHEST 1 VIEW COMPARISON:  None. FINDINGS: The lungs are well-aerated. Vascular congestion is noted. Mildly increased interstitial markings may reflect mild interstitial edema. No definite pleural effusion or pneumothorax is seen. The cardiomediastinal silhouette is mildly enlarged. The patient is status post CABG. No acute osseous abnormalities are seen. IMPRESSION: Vascular congestion and mild cardiomegaly. Mildly increased interstitial markings may reflect mild interstitial edema. Electronically Signed   By: Roanna Raider M.D.   On: 09/14/2015 01:59   ASSESSMENT / PLAN:  PULMONARY A: Acute hypoxic respiratory failure 2/2 CHF exacerbation. CXR showing vascular congestion and BNP in 700s. ABG consistent with acute on chronic respiratory acidosis. Patient does not have a documented history of COPD. PE was ruled out by CTA. However, CTA did show a LLL mass lesion with central fluid density which is concerning for carcinoma of the lung. Lung abscess or pneumonia is also a possibility. CTA also showed three lung nodules on the right. As per chart, patient is a never smoker. P:   -Intubate patient as she is obtunded; wean as tolerated  -increase Lasix to IV 80 mg TID -KCl 20 meq BID -cont Digoxin 0.125 mg daily  -cont Lisinopril 10 mg daily   -IV Metoprolol 2.5-5 mg q3 prn  -Hydralazine 10 mg IV once -Versed 1 mg IV prn agitation to maintain RAAS goal  -Fentanyl 50 mcg IV prn  -check pro-calcitonin, if high consider empiric antibiotics. Otherwise, next step would be CT guided biopsy of the LLL mass lesion. Repeat CXR in 4-6 weeks.  -check influenza PCR, if negative check respiratory panel  -check sputum gram stain and culture    CARDIOVASCULAR A: CHF exacerbation  A-fib HTN HLD P:  -increase Lasix to IV 80 mg TID -KCl 20 meq BID -cont Digoxin 0.125 mg daily  -cont Lisinopril 10 mg daily   -IV Metoprolol 2.5-5 mg q3 prn  -Hydralazine 10 mg IV once  -Simvastatin 20 mg daily  -Heparin and Coumadin per  pharmacy  -Strict I&Os  RENAL A:  P:     GASTROINTESTINAL A:  GI ppx P:   -Protonix 40 mg daily  -Zofran 4 mg q6 prn   HEMATOLOGIC A:  Thrombocytopenia (chronic)  P:  -Continue to monitor CBC  INFECTIOUS A:  Possible pneumonia? Possible viral URI? P:   -check pro-calcitonin, if high give empiric antibiotics  -droplet precautions -check influenza PCR, if negative respiratory panel   ENDOCRINE A:  DM2 Hypothyroidism  P:   -IV Levothyroxine 62.5 mcg daily  -SSI  NEUROLOGIC A:  Obtunded  P:   -continue to monitor   Rheumatological  A: Gout  P: -Allopurinol 300 mg qd -Colchicine 0.6 mg daily    I have personally obtained a history, examined the patient, evaluated laboratory and imaging results, formulated the assessment and plan and placed orders. CRITICAL CARE: The patient is critically ill with multiple organ systems failure and requires high complexity decision making for assessment and support, frequent evaluation and titration of therapies, application of advanced monitoring technologies and extensive interpretation of multiple databases. Critical Care Time devoted to patient care services described in this note is minutes.    Pulmonary and Critical Care Medicine [facility] Pager: [phone]  09/15/2015, 4:18 AM

## 2015-09-16 ENCOUNTER — Inpatient Hospital Stay (HOSPITAL_COMMUNITY): Payer: Medicare Other

## 2015-09-16 DIAGNOSIS — I480 Paroxysmal atrial fibrillation: Secondary | ICD-10-CM

## 2015-09-16 DIAGNOSIS — N179 Acute kidney failure, unspecified: Secondary | ICD-10-CM

## 2015-09-16 DIAGNOSIS — J810 Acute pulmonary edema: Secondary | ICD-10-CM

## 2015-09-16 DIAGNOSIS — E876 Hypokalemia: Secondary | ICD-10-CM | POA: Diagnosis present

## 2015-09-16 DIAGNOSIS — J9601 Acute respiratory failure with hypoxia: Secondary | ICD-10-CM | POA: Insufficient documentation

## 2015-09-16 DIAGNOSIS — I5023 Acute on chronic systolic (congestive) heart failure: Secondary | ICD-10-CM

## 2015-09-16 LAB — COMPREHENSIVE METABOLIC PANEL
ALK PHOS: 60 U/L (ref 38–126)
ALT: 22 U/L (ref 14–54)
AST: 34 U/L (ref 15–41)
Albumin: 3 g/dL — ABNORMAL LOW (ref 3.5–5.0)
Anion gap: 11 (ref 5–15)
BILIRUBIN TOTAL: 0.8 mg/dL (ref 0.3–1.2)
BUN: 43 mg/dL — AB (ref 6–20)
CALCIUM: 8.7 mg/dL — AB (ref 8.9–10.3)
CHLORIDE: 95 mmol/L — AB (ref 101–111)
CO2: 32 mmol/L (ref 22–32)
CREATININE: 1.45 mg/dL — AB (ref 0.44–1.00)
GFR, EST AFRICAN AMERICAN: 37 mL/min — AB (ref >60)
GFR, EST NON AFRICAN AMERICAN: 32 mL/min — AB (ref >60)
Glucose, Bld: 163 mg/dL — ABNORMAL HIGH (ref 65–99)
Potassium: 3.4 mmol/L — ABNORMAL LOW (ref 3.5–5.1)
Sodium: 138 mmol/L (ref 135–145)
TOTAL PROTEIN: 5.9 g/dL — AB (ref 6.5–8.1)

## 2015-09-16 LAB — GLUCOSE, CAPILLARY
GLUCOSE-CAPILLARY: 179 mg/dL — AB (ref 65–99)
GLUCOSE-CAPILLARY: 198 mg/dL — AB (ref 65–99)
GLUCOSE-CAPILLARY: 222 mg/dL — AB (ref 65–99)
Glucose-Capillary: 164 mg/dL — ABNORMAL HIGH (ref 65–99)
Glucose-Capillary: 184 mg/dL — ABNORMAL HIGH (ref 65–99)
Glucose-Capillary: 235 mg/dL — ABNORMAL HIGH (ref 65–99)

## 2015-09-16 LAB — PROTIME-INR
INR: 3.07 — ABNORMAL HIGH (ref 0.00–1.49)
Prothrombin Time: 31.1 seconds — ABNORMAL HIGH (ref 11.6–15.2)

## 2015-09-16 MED ORDER — ALBUTEROL SULFATE (2.5 MG/3ML) 0.083% IN NEBU
2.5000 mg | INHALATION_SOLUTION | RESPIRATORY_TRACT | Status: DC | PRN
  Administered 2015-09-16: 2.5 mg via RESPIRATORY_TRACT
  Filled 2015-09-16: qty 3

## 2015-09-16 MED ORDER — TRAZODONE HCL 50 MG PO TABS
50.0000 mg | ORAL_TABLET | Freq: Every day | ORAL | Status: DC
  Administered 2015-09-16: 50 mg via ORAL
  Filled 2015-09-16 (×4): qty 1

## 2015-09-16 MED ORDER — POTASSIUM CHLORIDE IN NACL 20-0.45 MEQ/L-% IV SOLN
INTRAVENOUS | Status: DC
  Administered 2015-09-16 – 2015-09-17 (×2): via INTRAVENOUS
  Filled 2015-09-16 (×2): qty 1000

## 2015-09-16 MED ORDER — MIDAZOLAM HCL 2 MG/2ML IJ SOLN
2.0000 mg | Freq: Once | INTRAMUSCULAR | Status: AC
  Administered 2015-09-16: 2 mg via INTRAVENOUS
  Filled 2015-09-16: qty 2

## 2015-09-16 MED ORDER — METOPROLOL TARTRATE 25 MG PO TABS
25.0000 mg | ORAL_TABLET | Freq: Two times a day (BID) | ORAL | Status: DC
  Administered 2015-09-16 – 2015-09-17 (×3): 25 mg via ORAL
  Filled 2015-09-16 (×4): qty 1

## 2015-09-16 MED ORDER — FUROSEMIDE 10 MG/ML IJ SOLN: 40.0000 mg | Freq: Every day | INTRAMUSCULAR | Status: DC

## 2015-09-16 NOTE — Progress Notes (Signed)
    Subjective:  Intubated and sedated   Objective:  Filed Vitals:   09/16/15 1000 09/16/15 1100 09/16/15 1130 09/16/15 1142  BP: 171/87 163/96 163/96   Pulse: 109 109 99   Temp:    98.3 F (36.8 C)  TempSrc:    Oral  Resp: 22 23 24    Height:      Weight:      SpO2: 99% 100% 100%     Intake/Output from previous day:  Intake/Output Summary (Last 24 hours) at 09/16/15 1153 Last data filed at 09/16/15 1000  Gross per 24 hour  Intake    275 ml  Output   4570 ml  Net  -4295 ml    Physical Exam: Physical exam: Well-developed well-nourished intubated.  Skin is warm and dry.  HEENT is normal.  Neck is supple.  Chest CTA anteriorly Cardiovascular exam irregular, 2/6 systolic murmur LSB  Abdominal exam nontender or distended. No masses palpated. Extremities show no edema. Neuro not assessed as pt sedated and intubated    Lab Results: Basic Metabolic Panel:  Recent Labs  16/06/9600/06/17 0730 09/15/15 1045 09/15/15 1258 09/16/15 0218  NA 134* 134*  --  138  K 4.5 4.1  --  3.4*  CL 93* 92*  --  95*  CO2 32 31  --  32  GLUCOSE 183* 126*  --  163*  BUN 37* 40*  --  43*  CREATININE 1.25* 1.25*  --  1.45*  CALCIUM 9.0 8.8*  --  8.7*  MG 2.0 2.0  --   --   PHOS 4.3  --  2.0*  --    CBC:  Recent Labs  09/14/15 0108 09/15/15 0730  WBC 9.3 10.7*  NEUTROABS 4.4  --   HGB 13.6 11.9*  HCT 42.6 38.2  MCV 90.1 89.9  PLT 149* 157   Cardiac Enzymes:  Recent Labs  09/15/15 1045 09/15/15 1640 09/15/15 1902  TROPONINI 0.06* 0.06* 0.07*     Assessment/Plan:  1 atrial fibrillation- Patient's heart rate is now 100-110. I would discontinue digoxin long term. Resume metoprolol at lower dose. We will begin at 25 mg twice a day. Increase medications as needed. Continue heparin. Resume Coumadin once extubated. 2 VDRF-management per CCM 3 Question pneumonia-antibiotics per critical care medicine. 4 left lower lobe mass-will need further evaluation. 5 acute on chronic  combined systolic and diastolic congestive heart failure-patient does not appear to be volume overloaded. Renal function deteriorating. Decrease Lasix. 6 acute renal failure-likely from over diuresis. Decrease Lasix and follow. 7. Coronary artery disease-troponins are not consistent with acute coronary syndrome. Can plan nuclear study following discharge. No aspirin long term given need for Coumadin. Continue statin at discharge.   Lori Harrington 09/16/2015, 11:53 AM

## 2015-09-16 NOTE — Progress Notes (Signed)
ANTICOAGULATION CONSULT NOTE - Follow-up Consult  Pharmacy Consult for heparin (holding warfarin) Indication: atrial fibrillation  Allergies  Allergen Reactions  . Adhesive [Tape]     Skin turns red and itching   . Codeine     Stop breathing   . Oxycodone     unknown    Patient Measurements: Height: 5\' 9"  (175.3 cm) Weight: 202 lb 9.6 oz (91.9 kg) IBW/kg (Calculated) : 66.2 Heparin Dosing Weight: 86.54 kg  Vital Signs: Temp: 99.2 F (37.3 C) (01/07 0752) Temp Source: Oral (01/07 0752) BP: 153/83 mmHg (01/07 0800) Pulse Rate: 101 (01/07 0800)  Labs:  Recent Labs  09/14/15 0108 09/15/15 0730 09/15/15 1045 09/15/15 1640 09/15/15 1902 09/16/15 0218  HGB 13.6 11.9*  --   --   --   --   HCT 42.6 38.2  --   --   --   --   PLT 149* 157  --   --   --   --   APTT 36  --   --   --   --   --   LABPROT 20.5* 25.6*  --   --   --  31.1*  INR 1.76* 2.36*  --   --   --  3.07*  CREATININE 0.91 1.25* 1.25*  --   --  1.45*  TROPONINI 0.03 0.05* 0.06* 0.06* 0.07*  --     Estimated Creatinine Clearance: 35.5 mL/min (by C-G formula based on Cr of 1.45).   Medical History: Past Medical History  Diagnosis Date  . CHF (congestive heart failure) (HCC)   . Anemia   . Mitral valve disorder   . Atrial fibrillation (HCC)   . Pure hypercholesterolemia   . Proteinuria    Assessment: 82 yo F admitted 09/14/2015 with shortness of breath and respiratory distress. Patient on warfarin PTA for Afib. Currently holding warfarin, INR this morning 3.07. H/H stable, Plt wnl. No s/sx of bleeding noted  Pharmacy consulted to start heparin once INR <2.   Goal of Therapy:  Heparin level 0.3-0.7 units/ml Monitor platelets by anticoagulation protocol: Yes   Plan:  - Continue to hold heparin - Daily INR until heparin initiation   Isaac BlissMichael Kristopher Delk, PharmD, BCPS, [facility] Clinical Pharmacist Pager [phone] 09/16/2015 9:35 AM

## 2015-09-16 NOTE — Clinical Documentation Improvement (Signed)
Critical Care  Patient's serum Creatinine's have risen from 0.91 on admit to 1.45 with this morning's results. Please provide a diagnosis associated with clinical indicators and document findings in next progress note; NOT in BPA drop down box. Thanks!   Other  Clinically Undetermined  Please exercise your independent, professional judgment when responding. A specific answer is not anticipated or expected.  Thank You, Shellee MiloEileen T Elyan Vanwieren RN, BSN, CCDS Health Information Management [place] [phone]; Cell: [phone]

## 2015-09-16 NOTE — Progress Notes (Signed)
PULMONARY / CRITICAL CARE MEDICINE   Name: Lori Harrington MRN: [id number] DOB: 11/11/1929    ADMISSION DATE:  09/14/2015 CONSULTATION DATE: 09/15/15  REFERRING MD : Dr. Konrad Dolores  PRIMARY SERVICE: Critical Care   CHIEF COMPLAINT:  SOB  BRIEF DESCRIPTION:  82 y/o female with CHF, afib admitted with acute on chronic respiratory failure requiring intubation.  Patient is on Bipap. As such, history obtained from hospitalist note.   SUBJECTIVE:  weaned 3 hours, became agitated, back to full support  VITAL SIGNS: Temp:  [98.3 F (36.8 C)-99.9 F (37.7 C)] 98.3 F (36.8 C) (01/07 1142) Pulse Rate:  [44-114] 99 (01/07 1200) Resp:  [0-38] 21 (01/07 1200) BP: (117-171)/(53-96) 142/84 mmHg (01/07 1200) SpO2:  [91 %-100 %] 100 % (01/07 1200) FiO2 (%):  [30 %] 30 % (01/07 1200) Weight:  [91.9 kg (202 lb 9.6 oz)] 91.9 kg (202 lb 9.6 oz) (01/07 0500) HEMODYNAMICS:   VENTILATOR SETTINGS: Vent Mode:  [-] PRVC FiO2 (%):  [30 %] 30 % Set Rate:  [14 bmp] 14 bmp Vt Set:  [530 mL] 530 mL PEEP:  [5 cmH20] 5 cmH20 Pressure Support:  [5 cmH20] 5 cmH20 Plateau Pressure:  [16 cmH20-20 cmH20] 19 cmH20 INTAKE / OUTPUT: Intake/Output      01/06 0701 - 01/07 0700 01/07 0701 - 01/08 0700   P.O.     NG/GT 300 125   Total Intake(mL/kg) 300 (3.3) 125 (1.4)   Urine (mL/kg/hr) 3950 (1.8) 1980 (2.9)   Total Output 3950 1980   Net -3650 -1855          PHYSICAL EXAMINATION: General: awake on vent Neuro: awake, alert, writing questions, maew  HEENT: NCAT, ETT in place  Cardiovascular: irreg irreg. S1 and S2 WNL Lungs: Wheeze on R, diminished on left Abdomen: BS+ soft, nontender.  Musculoskeletal: trace edema feet Skin: thin, no bruising  LABS:  CBC  Recent Labs Lab 09/14/15 0108 09/15/15 0730  WBC 9.3 10.7*  HGB 13.6 11.9*  HCT 42.6 38.2  PLT 149* 157   Coag's  Recent Labs Lab 09/14/15 0108 09/15/15 0730 09/16/15 0218  APTT 36  --   --   INR 1.76* 2.36* 3.07*   BMET  Recent  Labs Lab 09/15/15 0730 09/15/15 1045 09/16/15 0218  NA 134* 134* 138  K 4.5 4.1 3.4*  CL 93* 92* 95*  CO2 32 31 32  BUN 37* 40* 43*  CREATININE 1.25* 1.25* 1.45*  GLUCOSE 183* 126* 163*   Electrolytes  Recent Labs Lab 09/15/15 0730 09/15/15 1045 09/15/15 1258 09/16/15 0218  CALCIUM 9.0 8.8*  --  8.7*  MG 2.0 2.0  --   --   PHOS 4.3  --  2.0*  --    Sepsis Markers  Recent Labs Lab 09/14/15 1420 09/15/15 0730 09/15/15 1259  LATICACIDVEN 1.4 1.8 1.5  PROCALCITON  --  0.97  --    ABG  Recent Labs Lab 09/15/15 0510 09/15/15 0656 09/15/15 1103  PHART 7.272* 7.379 7.490*  PCO2ART 68.1* 58.2* 43.3  PO2ART 99.9 430.0* 114.0*   Liver Enzymes  Recent Labs Lab 09/16/15 0218  AST 34  ALT 22  ALKPHOS 60  BILITOT 0.8  ALBUMIN 3.0*   Cardiac Enzymes  Recent Labs Lab 09/15/15 1045 09/15/15 1640 09/15/15 1902  TROPONINI 0.06* 0.06* 0.07*   Glucose  Recent Labs Lab 09/15/15 1511 09/15/15 1951 09/16/15 0012 09/16/15 0325 09/16/15 0748 09/16/15 1150  GLUCAP 86 106* 179* 164* 184* 222*    Imaging CXR 1/7  reviewed> ett in place, atelectasis left lung base, interstitial infiltrate R lung  ASSESSMENT / PLAN:  PULMONARY A: Acute hypoxic respiratory failure 2/2 CHF exacerbation Acute bronchitis Flail chest, weak on SBT today  P:   Full vent support Hold diuresis Continue bronchitis treatment Add albuterol as wheezing SBT in AM CXR in AM  CARDIOVASCULAR A: CHF exacerbation  > improved, now probably over diuresed today A-fib HTN HLD P:  Hold lasix cont Digoxin 0.125 mg daily  cont Lisinopril 10 mg daily   Continue IV Metoprolol 2.5-5 mg q3 prn  Simvastatin 20 mg daily  Heparin and Coumadin per pharmacy  Strict I&Os  RENAL A:  Hypokalemia AKI this morning, appears overdiuresed P:   IVF today, give potassium with it Monitor BMET and UOP Replace electrolytes as needed  GASTROINTESTINAL A:  GI ppx P:   Protonix 40 mg daily   Zofran 4 mg q6 prn  Tube feedings to continue  HEMATOLOGIC A:  Thrombocytopenia (chronic)  P:  Continue to monitor CBC Monitor for bleeding  INFECTIOUS A:   Acute bronchitis P:   F/u viral PCR Levaquin monotherapy for now  ENDOCRINE A:  DM2 Hypothyroidism  P:   Continue synthroid Continue SSI  NEUROLOGIC A:  Awake, alert Chronic pain all over [place] P:   RASS goal -1 Fentanyl prn Versed prn Trazodone > add tonight   Rheumatological  A: Gout  P: Allopurinol 300 mg qd Colchicine 0.6 mg daily    I have personally obtained a history, examined the patient, evaluated laboratory and imaging results, formulated the assessment and plan and placed orders. CRITICAL CARE: The patient is critically ill with multiple organ systems failure and requires high complexity decision making for assessment and support, frequent evaluation and titration of therapies, application of advanced monitoring technologies and extensive interpretation of multiple databases. Critical Care Time devoted to patient care services described in this note is 32 minutes.    Heber CarolinaBrent Lucciana Head, MD [place] [facility] Pager: [phone] Cell: [phone] After 3pm or if no response, call [phone]   09/16/2015, 2:31 PM

## 2015-09-17 ENCOUNTER — Inpatient Hospital Stay (HOSPITAL_COMMUNITY): Payer: Medicare Other

## 2015-09-17 DIAGNOSIS — I482 Chronic atrial fibrillation: Secondary | ICD-10-CM

## 2015-09-17 LAB — COMPREHENSIVE METABOLIC PANEL
ALK PHOS: 59 U/L (ref 38–126)
ALT: 20 U/L (ref 14–54)
ANION GAP: 9 (ref 5–15)
AST: 30 U/L (ref 15–41)
Albumin: 2.9 g/dL — ABNORMAL LOW (ref 3.5–5.0)
BUN: 36 mg/dL — ABNORMAL HIGH (ref 6–20)
CALCIUM: 8.6 mg/dL — AB (ref 8.9–10.3)
CHLORIDE: 96 mmol/L — AB (ref 101–111)
CO2: 35 mmol/L — AB (ref 22–32)
Creatinine, Ser: 1.13 mg/dL — ABNORMAL HIGH (ref 0.44–1.00)
GFR calc Af Amer: 51 mL/min — ABNORMAL LOW (ref >60)
GFR calc non Af Amer: 44 mL/min — ABNORMAL LOW (ref >60)
Glucose, Bld: 266 mg/dL — ABNORMAL HIGH (ref 65–99)
POTASSIUM: 3.4 mmol/L — AB (ref 3.5–5.1)
SODIUM: 140 mmol/L (ref 135–145)
Total Bilirubin: 1 mg/dL (ref 0.3–1.2)
Total Protein: 6.1 g/dL — ABNORMAL LOW (ref 6.5–8.1)

## 2015-09-17 LAB — CBC WITH DIFFERENTIAL/PLATELET
BASOS PCT: 0 %
Basophils Absolute: 0 10*3/uL (ref 0.0–0.1)
EOS ABS: 0 10*3/uL (ref 0.0–0.7)
Eosinophils Relative: 0 %
HCT: 37.5 % (ref 36.0–46.0)
Hemoglobin: 12.4 g/dL (ref 12.0–15.0)
LYMPHS PCT: 24 %
Lymphs Abs: 2.8 10*3/uL (ref 0.7–4.0)
MCH: 28.8 pg (ref 26.0–34.0)
MCHC: 33.1 g/dL (ref 30.0–36.0)
MCV: 87 fL (ref 78.0–100.0)
MONOS PCT: 12 %
Monocytes Absolute: 1.4 10*3/uL — ABNORMAL HIGH (ref 0.1–1.0)
NEUTROS PCT: 64 %
Neutro Abs: 7.3 10*3/uL (ref 1.7–7.7)
PLATELETS: 168 10*3/uL (ref 150–400)
RBC: 4.31 MIL/uL (ref 3.87–5.11)
RDW: 15.1 % (ref 11.5–15.5)
WBC: 11.5 10*3/uL — ABNORMAL HIGH (ref 4.0–10.5)

## 2015-09-17 LAB — GLUCOSE, CAPILLARY
GLUCOSE-CAPILLARY: 234 mg/dL — AB (ref 65–99)
GLUCOSE-CAPILLARY: 236 mg/dL — AB (ref 65–99)
GLUCOSE-CAPILLARY: 255 mg/dL — AB (ref 65–99)
Glucose-Capillary: 201 mg/dL — ABNORMAL HIGH (ref 65–99)
Glucose-Capillary: 185 mg/dL — ABNORMAL HIGH (ref 65–99)
Glucose-Capillary: 259 mg/dL — ABNORMAL HIGH (ref 65–99)

## 2015-09-17 LAB — BLOOD GAS, ARTERIAL
Acid-Base Excess: 7 mmol/L — ABNORMAL HIGH (ref 0.0–2.0)
Bicarbonate: 35.2 mEq/L — ABNORMAL HIGH (ref 20.0–24.0)
DRAWN BY: 236041
O2 CONTENT: 4 L/min
O2 SAT: 97.3 %
PATIENT TEMPERATURE: 94.6
PO2 ART: 98.2 mmHg (ref 80.0–100.0)
TCO2: 38.3 mmol/L (ref 0–100)
pCO2 arterial: 90.1 mmHg (ref 35.0–45.0)
pH, Arterial: 7.2 — ABNORMAL LOW (ref 7.350–7.450)

## 2015-09-17 LAB — PROTIME-INR
INR: 2 — ABNORMAL HIGH (ref 0.00–1.49)
Prothrombin Time: 22.5 seconds — ABNORMAL HIGH (ref 11.6–15.2)

## 2015-09-17 LAB — PROCALCITONIN: PROCALCITONIN: 0.42 ng/mL

## 2015-09-17 MED ORDER — INSULIN ASPART 100 UNIT/ML ~~LOC~~ SOLN
2.0000 [IU] | SUBCUTANEOUS | Status: DC
  Administered 2015-09-17: 2 [IU] via SUBCUTANEOUS

## 2015-09-17 MED ORDER — POLYETHYLENE GLYCOL 3350 17 G PO PACK
17.0000 g | PACK | Freq: Every day | ORAL | Status: DC
  Administered 2015-09-17: 17 g
  Filled 2015-09-17 (×2): qty 1

## 2015-09-17 MED ORDER — FUROSEMIDE 10 MG/ML IJ SOLN
40.0000 mg | Freq: Once | INTRAMUSCULAR | Status: AC
  Administered 2015-09-17: 40 mg via INTRAVENOUS

## 2015-09-17 MED ORDER — GUAIFENESIN ER 600 MG PO TB12
1200.0000 mg | ORAL_TABLET | Freq: Two times a day (BID) | ORAL | Status: DC
  Administered 2015-09-17: 1200 mg via ORAL
  Filled 2015-09-17 (×4): qty 2

## 2015-09-17 MED ORDER — METOPROLOL TARTRATE 50 MG PO TABS
50.0000 mg | ORAL_TABLET | Freq: Two times a day (BID) | ORAL | Status: DC
  Filled 2015-09-17 (×3): qty 1

## 2015-09-17 MED ORDER — WARFARIN - PHARMACIST DOSING INPATIENT
Freq: Every day | Status: DC
  Administered 2015-09-19 – 2015-09-21 (×2)

## 2015-09-17 MED ORDER — POTASSIUM CHLORIDE 20 MEQ/15ML (10%) PO SOLN
40.0000 meq | Freq: Two times a day (BID) | ORAL | Status: DC
  Administered 2015-09-17: 40 meq
  Filled 2015-09-17 (×3): qty 30

## 2015-09-17 MED ORDER — CHLORHEXIDINE GLUCONATE 0.12 % MT SOLN
15.0000 mL | Freq: Two times a day (BID) | OROMUCOSAL | Status: DC
  Administered 2015-09-17 – 2015-09-18 (×4): 15 mL via OROMUCOSAL
  Filled 2015-09-17: qty 15

## 2015-09-17 MED ORDER — HYDRALAZINE HCL 20 MG/ML IJ SOLN
20.0000 mg | Freq: Once | INTRAMUSCULAR | Status: AC
  Administered 2015-09-17: 20 mg via INTRAVENOUS
  Filled 2015-09-17: qty 1

## 2015-09-17 MED ORDER — CETYLPYRIDINIUM CHLORIDE 0.05 % MT LIQD
7.0000 mL | Freq: Two times a day (BID) | OROMUCOSAL | Status: DC
  Administered 2015-09-17 – 2015-09-18 (×4): 7 mL via OROMUCOSAL

## 2015-09-17 MED ORDER — WARFARIN SODIUM 7.5 MG PO TABS
7.5000 mg | ORAL_TABLET | Freq: Once | ORAL | Status: AC
  Administered 2015-09-17: 7.5 mg via ORAL
  Filled 2015-09-17: qty 1

## 2015-09-17 MED ORDER — FENTANYL CITRATE (PF) 100 MCG/2ML IJ SOLN: 12.5000 ug | INTRAMUSCULAR | Status: DC | PRN

## 2015-09-17 NOTE — Procedures (Signed)
Extubation Procedure Note  Patient Details:   Name: Lori Harrington DOB: [birth year] MRN: [id number]   Airway Documentation:     Evaluation  O2 sats: stable throughout Complications: No apparent complications Patient did tolerate procedure well. Bilateral Breath Sounds: Clear Suctioning: Oral, Nasal, Airway Yes  Pt tolerated well placed on lpm Lori Harrington, able to vocalize   Lori Harrington, Lori Harrington 09/17/2015, 10:34 AM

## 2015-09-17 NOTE — Progress Notes (Signed)
Notified Wandra ScotYaunka Elink RN in regards to patient's temperature being 93.8 F Rectally, will place bear hugger. Also, notified in regards to patient having trouble swallowing pills earlier for previous shift and will need a swallow evaluation in AM.Will hold 2200 Meds for now. Also patient had been receiving K in fluids during day shift. Concerned for patient K. Elink RN Vic RipperYaunka said will recheck BMET in AM. Elink RN Vic RipperYaunka will pass along to [facility]. Will continue to monitor and assess.

## 2015-09-17 NOTE — Progress Notes (Signed)
ANTICOAGULATION CONSULT NOTE - Follow-up Consult  Pharmacy Consult for warfarin Indication: atrial fibrillation  Allergies  Allergen Reactions  . Adhesive [Tape]     Skin turns red and itching   . Codeine     Stop breathing   . Oxycodone     unknown    Patient Measurements: Height: 5\' 9"  (175.3 cm) Weight: 195 lb 12.3 oz (88.8 kg) IBW/kg (Calculated) : 66.2 Heparin Dosing Weight: 86.54 kg  Vital Signs: Temp: 98.9 F (37.2 C) (01/08 0805) Temp Source: Oral (01/08 0805) BP: 147/79 mmHg (01/08 0735) Pulse Rate: 109 (01/08 0735)  Labs:  Recent Labs  09/15/15 0730 09/15/15 1045 09/15/15 1640 09/15/15 1902 09/16/15 0218 09/17/15 0237 09/17/15 0655  HGB 11.9*  --   --   --   --  12.4  --   HCT 38.2  --   --   --   --  37.5  --   PLT 157  --   --   --   --  168  --   LABPROT 25.6*  --   --   --  31.1*  --  22.5*  INR 2.36*  --   --   --  3.07*  --  2.00*  CREATININE 1.25* 1.25*  --   --  1.45* 1.13*  --   TROPONINI 0.05* 0.06* 0.06* 0.07*  --   --   --     Estimated Creatinine Clearance: 44.8 mL/min (by C-G formula based on Cr of 1.13).  Assessment: 82 yo F admitted 09/14/2015 with SOB and respiratory distress  PMH: CHF, Warfarin PTA for Afib  AC/Heme: Warfarin PTA for Afib. INR 3.07 then 2 (originally to start heparin for bridge but will only do warfarin now - INR should only be therapeutic x 1 day low risk of clot)  PTA Warfarin: 4mg  daily  Goal of Therapy:  INR 2 - 3  Monitor platelets by anticoagulation protocol: Yes   Plan:  - Warfarin 7.5 mg x 1 tonight - Daily INR, CBC q72h - Monitor daily CBC and s/sx of bleeding  Isaac BlissMichael Lucero Ide, PharmD, BCPS, [facility] Clinical Pharmacist Pager [phone] 09/17/2015 11:37 AM

## 2015-09-17 NOTE — Progress Notes (Signed)
PULMONARY / CRITICAL CARE MEDICINE   Name: Lori Harrington MRN: [id number] DOB: 04-25-1930    ADMISSION DATE:  09/14/2015 CONSULTATION DATE: 09/15/15  REFERRING MD : Dr. Konrad Dolores  PRIMARY SERVICE: Critical Care   CHIEF COMPLAINT:  SOB  BRIEF DESCRIPTION:  82 y/o female with CHF, afib admitted with acute on chronic respiratory failure requiring intubation. Patient is on Bipap. As such, history obtained from hospitalist note.   SUBJECTIVE:  Awake alert and writing questions, coughing spontaneously on vent, tolerated temporary bipap  VITAL SIGNS: Temp:  [98.3 F (36.8 C)-99.7 F (37.6 C)] 99.7 F (37.6 C) (01/08 0458) Pulse Rate:  [75-115] 115 (01/08 0500) Resp:  [14-31] 21 (01/08 0500) BP: (130-179)/(60-96) 136/87 mmHg (01/08 0500) SpO2:  [98 %-100 %] 98 % (01/08 0500) FiO2 (%):  [30 %] 30 % (01/08 0500) Weight:  [88.8 kg (195 lb 12.3 oz)] 88.8 kg (195 lb 12.3 oz) (01/08 0450) HEMODYNAMICS:   VENTILATOR SETTINGS: Vent Mode:  [-] PRVC FiO2 (%):  [30 %] 30 % Set Rate:  [14 bmp] 14 bmp Vt Set:  [530 mL] 530 mL PEEP:  [5 cmH20] 5 cmH20 Pressure Support:  [5 cmH20] 5 cmH20 Plateau Pressure:  [18 cmH20-24 cmH20] 18 cmH20 INTAKE / OUTPUT: Intake/Output      01/07 0701 - 01/08 0700   I.V. (mL/kg) 825 (9.3)   NG/GT 625   Total Intake(mL/kg) 1450 (16.3)   Urine (mL/kg/hr) 3490 (1.6)   Total Output 3490   Net -2040         PHYSICAL EXAMINATION: General: awake on vent Neuro: awake, alert, follows commands, writing questions  HEENT: NCAT, ETT in place  Cardiovascular: irregular rate irregular rhythm, no murmur/rub/gallop Lungs: Rhonchorus+Wheeze on R, diminished on left Abdomen: BS+ soft, nontender.  Musculoskeletal: no pedal edema Skin: thin, no bruising  LABS:  CBC  Recent Labs Lab 09/14/15 0108 09/15/15 0730 09/17/15 0237  WBC 9.3 10.7* 11.5*  HGB 13.6 11.9* 12.4  HCT 42.6 38.2 37.5  PLT 149* 157 168   Coag's  Recent Labs Lab 09/14/15 0108 09/15/15 0730  09/16/15 0218  APTT 36  --   --   INR 1.76* 2.36* 3.07*   BMET  Recent Labs Lab 09/15/15 1045 09/16/15 0218 09/17/15 0237  NA 134* 138 140  K 4.1 3.4* 3.4*  CL 92* 95* 96*  CO2 31 32 35*  BUN 40* 43* 36*  CREATININE 1.25* 1.45* 1.13*  GLUCOSE 126* 163* 266*   Electrolytes  Recent Labs Lab 09/15/15 0730 09/15/15 1045 09/15/15 1258 09/16/15 0218 09/17/15 0237  CALCIUM 9.0 8.8*  --  8.7* 8.6*  MG 2.0 2.0  --   --   --   PHOS 4.3  --  2.0*  --   --    Sepsis Markers  Recent Labs Lab 09/14/15 1420 09/15/15 0730 09/15/15 1259 09/17/15 0237  LATICACIDVEN 1.4 1.8 1.5  --   PROCALCITON  --  0.97  --  0.42   ABG  Recent Labs Lab 09/15/15 0510 09/15/15 0656 09/15/15 1103  PHART 7.272* 7.379 7.490*  PCO2ART 68.1* 58.2* 43.3  PO2ART 99.9 430.0* 114.0*   Liver Enzymes  Recent Labs Lab 09/16/15 0218 09/17/15 0237  AST 34 30  ALT 22 20  ALKPHOS 60 59  BILITOT 0.8 1.0  ALBUMIN 3.0* 2.9*   Cardiac Enzymes  Recent Labs Lab 09/15/15 1045 09/15/15 1640 09/15/15 1902  TROPONINI 0.06* 0.06* 0.07*   Glucose  Recent Labs Lab 09/16/15 0748 09/16/15 1150 09/16/15 1621 09/16/15  2013 09/17/15 0050 09/17/15 0449  GLUCAP 184* 222* 198* 235* 255* 236*    Imaging CXR 1/7 reviewed> ett in place, atelectasis left lung base, interstitial infiltrate R lung  ASSESSMENT / PLAN:  PULMONARY A: Acute hypoxic respiratory failure 2/2 CHF exacerbation Acute bronchitis Flail chest, weak on SBT today P:   Full vent support Continue bronchitis treatment Cont albuterol for wheezing SBT in AM CXR pending  CARDIOVASCULAR A: CHF exacerbation  > improved w/ adequate/over diuresis A-fib HTN HLD P:  cont Digoxin 0.125 mg daily  cont Lisinopril 10 mg daily   Continue IV Metoprolol 2.5-5 mg q3 prn  Simvastatin 20 mg daily  INR last 3, holding f/u Heparin and Coumadin per pharmacy  Strict I&Os  RENAL A:  Hypokalemia Prerenal AKI improving with  fluids P:   Replace K Monitor BMET and UOP Gentle fluids  GASTROINTESTINAL A:  GI ppx, on A/C for Afib P:   Protonix 40 mg daily  Zofran 4 mg q6 prn  Add miralax for no stool x3days Tube feedings to continue  HEMATOLOGIC A:   Thrombocytopenia (chronic)  P:  Monitor CBC Monitor for bleeding  INFECTIOUS A:   Acute bronchitis P:   F/u viral PCR Levaquin monotherapy for now  ENDOCRINE A:   DM2 Hypothyroidism  P:   Continue synthroid Continue SSI +TF coverage now for hyperglycemia 260s  NEUROLOGIC A:   Awake, alert Chronic pain all over Insomnia P:   RASS goal -1 Fentanyl prn Versed prn Trazodone qhs  Rheumatological  A:  Gout  P: Allopurinol 300 mg qd Colchicine 0.6 mg daily   Fuller Planhristopher W Rice, MD PGY-I Internal Medicine Resident Pager# [phone] 09/17/2015, 6:56 AM   Attending:  I have seen and examined the patient with nurse practitioner/resident and agree with the note above.  My edits are in BOLD above.  We formulated the plan together and I elicited the following history.    [name] has improved today Diuresed well Still in afib Cr better  Rhonchi bilaterally, weak cough, flail chest irreg irreg, no clear murmur Belly> soft, nontender  Acute respiratory failure with hypoxemia> extubate today, continue O2 saturation for O2 sat > 90%, continue levaquin for bronchitis AKI> improving post diuresis, hold today Afib> warfarin/hep per pharmacy Lung mass left lung> strange appearance, is that an abscess? Probably needs repeat CT this admission to help us determine what to do with this, based on size can't ignore it  My cc time 35 minutes  Heber CarolinaBrent McQuaid, MD [facility] Pager: [phone] Cell: [phone] After 3pm or if no response, call [phone]

## 2015-09-17 NOTE — Progress Notes (Signed)
    Subjective:  Intubated; shakes head no to CP or dyspnea   Objective:  Filed Vitals:   09/17/15 0600 09/17/15 0700 09/17/15 0735 09/17/15 0805  BP: 126/74 147/79 147/79   Pulse: 99 104 109   Temp:    98.9 F (37.2 C)  TempSrc:    Oral  Resp: 19 16 24    Height:      Weight:      SpO2: 99% 100% 100%     Intake/Output from previous day:  Intake/Output Summary (Last 24 hours) at 09/17/15 1024 Last data filed at 09/17/15 0500  Gross per 24 hour  Intake   1375 ml  Output   2310 ml  Net   -935 ml    Physical Exam: Physical exam: Well-developed well-nourished intubated.  Skin is warm and dry.  HEENT is normal.  Neck is supple.  Chest CTA anteriorly Cardiovascular exam irregular, 2/6 systolic murmur LSB  Abdominal exam nontender or distended. No masses palpated. Extremities show no edema. Neuro not assessed as pt ntubated    Lab Results: Basic Metabolic Panel:  Recent Labs  16/06/9600/06/17 0730 09/15/15 1045 09/15/15 1258 09/16/15 0218 09/17/15 0237  NA 134* 134*  --  138 140  K 4.5 4.1  --  3.4* 3.4*  CL 93* 92*  --  95* 96*  CO2 32 31  --  32 35*  GLUCOSE 183* 126*  --  163* 266*  BUN 37* 40*  --  43* 36*  CREATININE 1.25* 1.25*  --  1.45* 1.13*  CALCIUM 9.0 8.8*  --  8.7* 8.6*  MG 2.0 2.0  --   --   --   PHOS 4.3  --  2.0*  --   --    CBC:  Recent Labs  09/15/15 0730 09/17/15 0237  WBC 10.7* 11.5*  NEUTROABS  --  7.3  HGB 11.9* 12.4  HCT 38.2 37.5  MCV 89.9 87.0  PLT 157 168   Cardiac Enzymes:  Recent Labs  09/15/15 1045 09/15/15 1640 09/15/15 1902  TROPONINI 0.06* 0.06* 0.07*     Assessment/Plan:  1 atrial fibrillation- Patient's heart rate is high normal. Digoxin DCed. Increase metoprolol to 50 BID. Increase medications as needed. Continue heparin. Resume Coumadin once extubated. 2 VDRF-management per CCM 3 Question pneumonia-antibiotics per critical care medicine. 4 left lower lobe mass-will need further evaluation. 5 acute on  chronic combined systolic and diastolic congestive heart failure-patient does not appear to be volume overloaded. Continue to hold lasix 6 acute renal failure-improved 7. Coronary artery disease-troponins are not consistent with acute coronary syndrome. Can plan nuclear study following discharge. No aspirin long term given need for Coumadin. Continue statin at discharge.   Olga MillersBrian Juanita Devincent 09/17/2015, 10:24 AM

## 2015-09-17 NOTE — Progress Notes (Signed)
SLP Cancellation Note  Patient Details Name: Marilynn RailJoyce A Cromwell MRN: [id number] DOB: 1930/03/22   Cancelled treatment:       Reason Eval/Treat Not Completed: Medical issues which prohibited therapy (Patient remains intubated. Will f/u 1/9. )  Ferdinand LangoLeah Pietro Bonura MA, CCC-SLP [phone]  Yedidya Duddy Meryl 09/17/2015, 10:22 AM

## 2015-09-17 NOTE — Progress Notes (Signed)
Notified Marylin CrosbyNikki Elink RN in regards to patient getting an order for SCDs. Patient is supposed to be receiving anticoagulation medication coumadin. [facility] Will continue to monitor and assess.

## 2015-09-18 ENCOUNTER — Inpatient Hospital Stay (HOSPITAL_COMMUNITY): Payer: Medicare Other

## 2015-09-18 DIAGNOSIS — I5043 Acute on chronic combined systolic (congestive) and diastolic (congestive) heart failure: Secondary | ICD-10-CM

## 2015-09-18 DIAGNOSIS — I482 Chronic atrial fibrillation, unspecified: Secondary | ICD-10-CM | POA: Diagnosis present

## 2015-09-18 DIAGNOSIS — R29898 Other symptoms and signs involving the musculoskeletal system: Secondary | ICD-10-CM

## 2015-09-18 LAB — CBC
HEMATOCRIT: 39.3 % (ref 36.0–46.0)
Hemoglobin: 13 g/dL (ref 12.0–15.0)
MCH: 29.3 pg (ref 26.0–34.0)
MCHC: 33.1 g/dL (ref 30.0–36.0)
MCV: 88.7 fL (ref 78.0–100.0)
PLATELETS: 390 10*3/uL (ref 150–400)
RBC: 4.43 MIL/uL (ref 3.87–5.11)
RDW: 19.5 % — ABNORMAL HIGH (ref 11.5–15.5)
WBC: 13.2 10*3/uL — AB (ref 4.0–10.5)

## 2015-09-18 LAB — PROCALCITONIN: PROCALCITONIN: 0.26 ng/mL

## 2015-09-18 LAB — BLOOD GAS, ARTERIAL
ACID-BASE EXCESS: 8.1 mmol/L — AB (ref 0.0–2.0)
ACID-BASE EXCESS: 12.1 mmol/L — AB (ref 0.0–2.0)
BICARBONATE: 34.5 meq/L — AB (ref 20.0–24.0)
Bicarbonate: 37 mEq/L — ABNORMAL HIGH (ref 20.0–24.0)
DELIVERY SYSTEMS: POSITIVE
Delivery systems: POSITIVE
Drawn by: 23604
Drawn by: 313941
EXPIRATORY PAP: 6
EXPIRATORY PAP: 6
FIO2: 0.4
FIO2: 0.4
Inspiratory PAP: 18
Inspiratory PAP: 18
LHR: 15 {breaths}/min
O2 SAT: 97.9 %
O2 SAT: 98.7 %
PATIENT TEMPERATURE: 96
PCO2 ART: 68.2 mmHg — AB (ref 35.0–45.0)
PH ART: 7.438 (ref 7.350–7.450)
PO2 ART: 98.4 mmHg (ref 80.0–100.0)
Patient temperature: 98.4
TCO2: 36.7 mmol/L (ref 0–100)
TCO2: 38.7 mmol/L (ref 0–100)
pCO2 arterial: 55.5 mmHg — ABNORMAL HIGH (ref 35.0–45.0)
pH, Arterial: 7.315 — ABNORMAL LOW (ref 7.350–7.450)
pO2, Arterial: 119 mmHg — ABNORMAL HIGH (ref 80.0–100.0)

## 2015-09-18 LAB — GLUCOSE, CAPILLARY
GLUCOSE-CAPILLARY: 210 mg/dL — AB (ref 65–99)
GLUCOSE-CAPILLARY: 241 mg/dL — AB (ref 65–99)
Glucose-Capillary: 278 mg/dL — ABNORMAL HIGH (ref 65–99)
Glucose-Capillary: 178 mg/dL — ABNORMAL HIGH (ref 65–99)
Glucose-Capillary: 163 mg/dL — ABNORMAL HIGH (ref 65–99)
Glucose-Capillary: 241 mg/dL — ABNORMAL HIGH (ref 65–99)

## 2015-09-18 LAB — BASIC METABOLIC PANEL
ANION GAP: 10 (ref 5–15)
BUN: 30 mg/dL — ABNORMAL HIGH (ref 6–20)
CHLORIDE: 100 mmol/L — AB (ref 101–111)
CO2: 36 mmol/L — ABNORMAL HIGH (ref 22–32)
Calcium: 8.9 mg/dL (ref 8.9–10.3)
Creatinine, Ser: 1.15 mg/dL — ABNORMAL HIGH (ref 0.44–1.00)
GFR calc Af Amer: 50 mL/min — ABNORMAL LOW (ref >60)
GFR, EST NON AFRICAN AMERICAN: 43 mL/min — AB (ref >60)
GLUCOSE: 196 mg/dL — AB (ref 65–99)
POTASSIUM: 3.9 mmol/L (ref 3.5–5.1)
SODIUM: 146 mmol/L — AB (ref 135–145)

## 2015-09-18 LAB — PROTIME-INR
INR: 1.87 — AB (ref 0.00–1.49)
Prothrombin Time: 21.4 seconds — ABNORMAL HIGH (ref 11.6–15.2)

## 2015-09-18 MED ORDER — METHYLPREDNISOLONE SODIUM SUCC 40 MG IJ SOLR
40.0000 mg | Freq: Two times a day (BID) | INTRAMUSCULAR | Status: DC
  Administered 2015-09-18 – 2015-09-20 (×5): 40 mg via INTRAVENOUS
  Filled 2015-09-18 (×7): qty 1

## 2015-09-18 MED ORDER — FUROSEMIDE 10 MG/ML IJ SOLN
60.0000 mg | Freq: Four times a day (QID) | INTRAMUSCULAR | Status: DC
  Administered 2015-09-18 – 2015-09-19 (×3): 60 mg via INTRAVENOUS
  Filled 2015-09-18 (×8): qty 6

## 2015-09-18 MED ORDER — SIMVASTATIN 20 MG PO TABS
20.0000 mg | ORAL_TABLET | Freq: Every day | ORAL | Status: DC
  Filled 2015-09-18: qty 1

## 2015-09-18 MED ORDER — POTASSIUM CHLORIDE 10 MEQ/100ML IV SOLN
10.0000 meq | INTRAVENOUS | Status: AC
  Administered 2015-09-18: 10 meq via INTRAVENOUS

## 2015-09-18 MED ORDER — SODIUM CHLORIDE 0.9 % IV SOLN
25.0000 ug/h | INTRAVENOUS | Status: DC
  Filled 2015-09-18: qty 50

## 2015-09-18 MED ORDER — HYDRALAZINE HCL 20 MG/ML IJ SOLN
10.0000 mg | Freq: Four times a day (QID) | INTRAMUSCULAR | Status: DC | PRN
  Administered 2015-09-18: 10 mg via INTRAVENOUS
  Filled 2015-09-18: qty 1

## 2015-09-18 MED ORDER — WARFARIN SODIUM 4 MG PO TABS
4.0000 mg | ORAL_TABLET | Freq: Once | ORAL | Status: AC
  Administered 2015-09-18: 4 mg via ORAL
  Filled 2015-09-18: qty 1

## 2015-09-18 MED ORDER — FUROSEMIDE 10 MG/ML IJ SOLN
40.0000 mg | Freq: Once | INTRAMUSCULAR | Status: AC
  Administered 2015-09-18: 40 mg via INTRAVENOUS

## 2015-09-18 MED ORDER — METOPROLOL TARTRATE 1 MG/ML IV SOLN
5.0000 mg | Freq: Four times a day (QID) | INTRAVENOUS | Status: DC
  Administered 2015-09-18 – 2015-09-20 (×7): 5 mg via INTRAVENOUS
  Filled 2015-09-18 (×10): qty 5

## 2015-09-18 MED ORDER — ACETAMINOPHEN 325 MG PO TABS
650.0000 mg | ORAL_TABLET | Freq: Four times a day (QID) | ORAL | Status: DC | PRN
  Administered 2015-09-18 – 2015-09-22 (×10): 650 mg via ORAL
  Filled 2015-09-18 (×13): qty 2

## 2015-09-18 MED ORDER — POTASSIUM CHLORIDE 20 MEQ/15ML (10%) PO SOLN: 40.0000 meq | Freq: Two times a day (BID) | ORAL | Status: DC

## 2015-09-18 MED ORDER — POTASSIUM CHLORIDE 10 MEQ/100ML IV SOLN
10.0000 meq | INTRAVENOUS | Status: AC
  Administered 2015-09-18 (×2): 10 meq via INTRAVENOUS
  Filled 2015-09-18 (×3): qty 100

## 2015-09-18 MED ORDER — POTASSIUM CHLORIDE 10 MEQ/100ML IV SOLN
10.0000 meq | Freq: Once | INTRAVENOUS | Status: AC
  Administered 2015-09-18: 10 meq via INTRAVENOUS
  Filled 2015-09-18: qty 100

## 2015-09-18 MED ORDER — METHYLPREDNISOLONE SODIUM SUCC 125 MG IJ SOLR
INTRAMUSCULAR | Status: AC
  Administered 2015-09-18: 40 mg
  Filled 2015-09-18: qty 2

## 2015-09-18 MED ORDER — NITROGLYCERIN 2 % TD OINT
0.5000 [in_us] | TOPICAL_OINTMENT | Freq: Four times a day (QID) | TRANSDERMAL | Status: DC
  Administered 2015-09-18 – 2015-09-21 (×9): 0.5 [in_us] via TOPICAL
  Filled 2015-09-18 (×2): qty 30

## 2015-09-18 MED ORDER — LEVOTHYROXINE SODIUM 100 MCG IV SOLR
50.0000 ug | Freq: Every day | INTRAVENOUS | Status: DC
  Administered 2015-09-18 – 2015-09-19 (×2): 50 ug via INTRAVENOUS
  Filled 2015-09-18 (×3): qty 5

## 2015-09-18 NOTE — Progress Notes (Signed)
ANTICOAGULATION CONSULT NOTE - Follow Up Consult  Pharmacy Consult for warfarin Indication: atrial fibrillation  Allergies  Allergen Reactions  . Adhesive [Tape]     Skin turns red and itching   . Codeine     Stop breathing   . Oxycodone     unknown    Patient Measurements: Height: 5\' 9"  (175.3 cm) Weight: 195 lb 12.3 oz (88.8 kg) IBW/kg (Calculated) : 66.2  Vital Signs: Temp: 98 F (36.7 C) (01/09 1203) Temp Source: Oral (01/09 1203) BP: 130/76 mmHg (01/09 0825) Pulse Rate: 99 (01/09 0825)  Labs:  Recent Labs  09/15/15 1640 09/15/15 1902 09/16/15 0218 09/17/15 0237 09/17/15 0655 09/18/15 0546 09/18/15 0734  HGB  --   --   --  12.4  --  13.0  --   HCT  --   --   --  37.5  --  39.3  --   PLT  --   --   --  168  --  390  --   LABPROT  --   --  31.1*  --  22.5* 21.4*  --   INR  --   --  3.07*  --  2.00* 1.87*  --   CREATININE  --   --  1.45* 1.13*  --   --  1.15*  TROPONINI 0.06* 0.07*  --   --   --   --   --     Estimated Creatinine Clearance: 44 mL/min (by C-G formula based on Cr of 1.15).   Medications:  Prescriptions prior to admission  Medication Sig Dispense Refill Last Dose  . allopurinol (ZYLOPRIM) 300 MG tablet Take 300 mg by mouth daily.   09/13/2015 at Unknown time  . Cholecalciferol (VITAMIN D-3) 1000 units CAPS Take 1 capsule by mouth daily.   09/14/2015 at Unknown time  . colchicine 0.6 MG tablet Take 0.6 mg by mouth daily as needed. For gout   Past Week at Unknown time  . digoxin (LANOXIN) 0.125 MG tablet Take 0.125 mg by mouth daily.    09/13/2015 at Unknown time  . docusate sodium (COLACE) 100 MG capsule Take 100 mg by mouth daily.    09/13/2015 at Unknown time  . furosemide (LASIX) 20 MG tablet Take 20 mg by mouth See admin instructions. Son states she takes bid then the next day she will take tid   09/13/2015 at Unknown time  . gabapentin (NEURONTIN) 300 MG capsule Take 300 mg by mouth 3 (three) times daily.   09/13/2015 at Unknown time  . insulin  glargine (LANTUS) 100 UNIT/ML injection Inject 40-44 Units into the skin at bedtime.   09/13/2015 at Unknown time  . Insulin Lispro (HUMALOG KWIKPEN [place]) Inject 20-30 units of lipase into the skin See admin instructions. Take 30 units at breakfast, 20 units at lunch, 28 units at supper per sliding scale   09/13/2015 at Unknown time  . levofloxacin (LEVAQUIN) 500 MG tablet Take 500 mg by mouth daily. Take for 10 days   09/13/2015 at Unknown time  . levothyroxine (SYNTHROID, LEVOTHROID) 125 MCG tablet Take 125 mcg by mouth daily before breakfast.   09/13/2015 at Unknown time  . lisinopril (PRINIVIL,ZESTRIL) 10 MG tablet Take 10 mg by mouth daily.   09/13/2015 at Unknown time  . metFORMIN (GLUCOPHAGE) 500 MG tablet Take 500 mg by mouth 2 (two) times daily with a meal.   09/13/2015 at Unknown time  . metoprolol (LOPRESSOR) 50 MG tablet Take 50 mg by mouth 2 (  two) times daily.   09/13/2015 at 0800  . ondansetron (ZOFRAN) 4 MG tablet Take 4 mg by mouth every 8 (eight) hours as needed for nausea or vomiting.   unknwon at unknown  . potassium chloride SA (K-DUR,KLOR-CON) 20 MEQ tablet Take 20 mEq by mouth 2 (two) times daily.   09/13/2015 at Unknown time  . simvastatin (ZOCOR) 20 MG tablet Take 20 mg by mouth at bedtime.   09/13/2015 at Unknown time  . traMADol (ULTRAM) 50 MG tablet Take 50 mg by mouth 3 (three) times daily.   09/13/2015 at Unknown time  . vitamin B-12 (CYANOCOBALAMIN) 1000 MCG tablet Take 1,000 mcg by mouth daily.   09/13/2015 at Unknown time  . warfarin (COUMADIN) 4 MG tablet Take 4 mg by mouth daily.   09/13/2015 at Unknown time    Assessment: 82 yo F admitted 09/14/2015 with SOB and respiratory distress on warfarin PTA for Afib. Warfarin was held on admission due to INR 3.07, restarted warfarin on 1/8.   PTA warfarin: 4 mg daily  INR 1.87, H/H stable, Plt wnl. No s/sx of bleeding noted.  Goal of Therapy:  INR 2-3 Monitor platelets by anticoagulation protocol: Yes   Plan:  - Warfarin 4 mg x1 tonight -  Monitor daily INR, CBC and s/sx of bleeding  Casilda Carlsaylor Blakelyn Dinges, PharmD. PGY-1 Pharmacy Resident Pager: [phone]  09/18/2015,12:45 PM

## 2015-09-18 NOTE — Progress Notes (Signed)
SLP Cancellation Note  Patient Details Name: Lori Harrington MRN: [id number] DOB: 03-17-1930   Cancelled treatment:       Reason Eval/Treat Not Completed: Medical issues which prohibited therapy. Attempted x2. Pt on BiPAP. Will attempt assessment of swallowing tomorrow.    [name], [name] 09/18/2015, 1:46 PM

## 2015-09-18 NOTE — Progress Notes (Signed)
CRITICAL VALUE ALERT  Critical value received: pH 7.2, pCo2 90.1, pO2 98.2, Bicarb 25.2   Date of notification:  09/17/15  Time of notification:  2350  Critical value read back: Yes  Nurse who received alert: Hazel Samshristian Luis Sami RN   MD notified (1st page):  MD Johna Rolesabbani and MD Loney Lohathore   Time of first page:  0000  MD notified (2nd page):  Time of second page:  Responding MD:   MD Johna Rolesabbani and MD Loney Lohathore   Time MD responded:  0000

## 2015-09-18 NOTE — Progress Notes (Signed)
Patient Name: Lori Harrington Date of Encounter: 09/18/2015     Active Problems:   Acute exacerbation of CHF (congestive heart failure) (HCC)   Diabetes mellitus, type 2 (HCC)   Acute respiratory failure (HCC)   Gout   Atrial fibrillation (HCC)   Hypothyroid   HTN (hypertension)   Thrombocytopenia (HCC)   Heart failure (HCC)   Acute respiratory failure with hypoxia (HCC)   Acute on chronic congestive heart failure (HCC)   Acute pulmonary edema (HCC)   Acute respiratory failure with hypoxemia (HCC)   Hypokalemia   AKI (acute kidney injury) (HCC)    SUBJECTIVE  Still on BiPAP.  No acute distress. Able to answer questions by shaking her head.  Denies chest pain.  CURRENT MEDS . allopurinol  300 mg Per Tube Daily  . antiseptic oral rinse  7 mL Mouth Rinse q12n4p  . chlorhexidine  15 mL Mouth Rinse BID  . colchicine  0.6 mg Per Tube Daily  . feeding supplement (PRO-STAT SUGAR FREE 64)  30 mL Oral QID  . furosemide  60 mg Intravenous Q6H  . guaiFENesin  1,200 mg Oral BID  . hydrALAZINE  10 mg Intravenous Once  . insulin aspart  0-15 Units Subcutaneous 6 times per day  . levofloxacin (LEVAQUIN) IV  750 mg Intravenous Q48H  . levothyroxine  125 mcg Per Tube QAC breakfast  . metoprolol tartrate  50 mg Oral BID  . polyethylene glycol  17 g Per Tube Daily  . potassium chloride  40 mEq Oral BID  . simvastatin  20 mg Oral QHS  . sodium chloride  3 mL Intravenous Q12H  . traZODone  50 mg Oral QHS  . Warfarin - Pharmacist Dosing Inpatient   Does not apply q1800    OBJECTIVE  Filed Vitals:   09/18/15 0500 09/18/15 0600 09/18/15 0700 09/18/15 0800  BP: 160/104 124/77 131/118 130/76  Pulse: 101 53 102 91  Temp:      TempSrc:      Resp: 21 18 19 19   Height:      Weight:      SpO2: 100% 99% 100% 100%    Intake/Output Summary (Last 24 hours) at 09/18/15 0829 Last data filed at 09/18/15 0400  Gross per 24 hour  Intake    240 ml  Output   1140 ml  Net   -900 ml   Filed  Weights   09/15/15 0446 09/16/15 0500 09/17/15 0450  Weight: 210 lb 8.6 oz (95.5 kg) 202 lb 9.6 oz (91.9 kg) 195 lb 12.3 oz (88.8 kg)    PHYSICAL EXAM  General: Pleasant, NAD. On BiPAP Neuro: Marland Kitchen Moves all extremities spontaneously. Psych: Normal affect. Anxious HEENT:  Normal  Neck: Supple without bruits or JVD. Lungs:  Resp regular and unlabored, Sonorous rhonchi bilaterally Heart: Irregularly irregular. Old sternotomy scar. No murmur or gallop or rub Abdomen: Soft, non-tender, non-distended, BS + x 4.  Extremities: No clubbing, cyanosis or edema. DP/PT/Radials 2+ and equal bilaterally.  Accessory Clinical Findings  CBC  Recent Labs  09/17/15 0237 09/18/15 0546  WBC 11.5* 13.2*  NEUTROABS 7.3  --   HGB 12.4 13.0  HCT 37.5 39.3  MCV 87.0 88.7  PLT 168 390   Basic Metabolic Panel  Recent Labs  09/15/15 1045 09/15/15 1258  09/17/15 0237 09/18/15 0734  NA 134*  --   < > 140 146*  K 4.1  --   < > 3.4* 3.9  CL 92*  --   < >  96* 100*  CO2 31  --   < > 35* 36*  GLUCOSE 126*  --   < > 266* 196*  BUN 40*  --   < > 36* 30*  CREATININE 1.25*  --   < > 1.13* 1.15*  CALCIUM 8.8*  --   < > 8.6* 8.9  MG 2.0  --   --   --   --   PHOS  --  2.0*  --   --   --   < > = values in this interval not displayed. Liver Function Tests  Recent Labs  09/16/15 0218 09/17/15 0237  AST 34 30  ALT 22 20  ALKPHOS 60 59  BILITOT 0.8 1.0  PROT 5.9* 6.1*  ALBUMIN 3.0* 2.9*   No results for input(s): LIPASE, AMYLASE in the last 72 hours. Cardiac Enzymes  Recent Labs  09/15/15 1045 09/15/15 1640 09/15/15 1902  TROPONINI 0.06* 0.06* 0.07*   BNP Invalid input(s): POCBNP D-Dimer No results for input(s): DDIMER in the last 72 hours. Hemoglobin A1C No results for input(s): HGBA1C in the last 72 hours. Fasting Lipid Panel No results for input(s): CHOL, HDL, LDLCALC, TRIG, CHOLHDL, LDLDIRECT in the last 72 hours. Thyroid Function Tests No results for input(s): TSH, T4TOTAL,  T3FREE, THYROIDAB in the last 72 hours.  Invalid input(s): FREET3  TELE  Atrial fibrillation. Rate 113  ECG    Radiology/Studies  Ct Angio Chest Pe W/cm &/or Wo Cm  09/14/2015  CLINICAL DATA:  Hypoxia. History of congestive heart failure and atrial fibrillation EXAM: CT ANGIOGRAPHY CHEST WITH CONTRAST TECHNIQUE: Multidetector CT imaging of the chest was performed using the standard protocol during bolus administration of intravenous contrast. Multiplanar CT image reconstructions and MIPs were obtained to evaluate the vascular anatomy. CONTRAST:  85mL OMNIPAQUE IOHEXOL 350 MG/ML SOLN COMPARISON:  Chest x-ray 09/13/2013 FINDINGS: Negative for pulmonary embolism. Mild pulmonary artery enlargement likely due to COPD. Prior CABG. Coronary calcification. Mild cardiac enlargement. Ascending aorta mildly dilated at 43 mm. Descending thoracic aorta atherosclerotic without aneurysm. No pericardial effusion Left lower lobe mass lesion measures 24 x 30 mm. There is central fluid density with surrounding enhancing soft tissue density. Two areas of sub solid density right upper lobe anteriorly measuring approximately 1 cm each. Right lower lobe 8 mm subpleural nodule axial image 131 series 506. Negative for pleural effusion. Precarinal lymph node measures 17 x 22 mm. No mediastinal mass. No hilar adenopathy. Upper abdomen reveals no acute abnormality. No acute skeletal abnormality. Review of the MIP images confirms the above findings. IMPRESSION: Negative for pulmonary embolism. Left lower lobe mass lesion with central fluid density. This may represent carcinoma of the lung. Lung abscess, pneumonia, and other benign etiologies also possible. Three lung nodules on the right as above. Differential includes malignancy and infection and scarring. Close follow-up recommended. Electronically Signed   By: Marlan Palauharles  Clark M.D.   On: 09/14/2015 10:51   Dg Chest Port 1 View  09/18/2015  CLINICAL DATA:  Dyspnea. EXAM:  PORTABLE CHEST 1 VIEW COMPARISON:  Most recent radiographs yesterday at 0452 hour. Chest CT 09/14/2015 FINDINGS: Patient has been extubated. Significant rotation to the right. Volume loss at the left lung base with dense basilar opacity, unchanged. Probable adjacent pleural effusion. Cardiomegaly is grossly stable. Interstitial edema has progressed. A skin fold projects over the right hemithorax. No pneumothorax. IMPRESSION: 1. Persistent volume loss in the left hemithorax with dense left basilar opacity, atelectasis/collapse versus pneumonia. Probable left pleural effusion. 2. Grossly  stable cardiomegaly.  Mild increased interstitial edema. Electronically Signed   By: Rubye Oaks M.D.   On: 09/18/2015 01:27   Dg Chest Port 1 View  09/17/2015  CLINICAL DATA:  Acute respiratory failure with hypoxia EXAM: PORTABLE CHEST 1 VIEW COMPARISON:  Chest x-rays dated 09/16/2015 and 09/15/2015. FINDINGS: Endotracheal tube well positioned with tip just above the level of the carina. Enteric tube passes below the diaphragm. Cardiomegaly is unchanged. Dense opacity at the left lung base persists, most suggestive of pneumonia or airspace collapse. Suspect small adjacent pleural effusion. Mild bile interstitial edema is unchanged. No new lung findings. No pneumothorax. IMPRESSION: 1.  Overall, no interval change. 2. Stable dense opacity at the left lung base, most likely airspace collapse or pneumonia. Suspect small adjacent pleural effusion. 3. Cardiomegaly with stable mild bilateral interstitial edema suggesting mild volume overload/CHF. Electronically Signed   By: Bary Richard M.D.   On: 09/17/2015 08:53   Portable Chest Xray  09/16/2015  CLINICAL DATA:  Shortness of breath EXAM: PORTABLE CHEST 1 VIEW COMPARISON:  Chest radiograph from one day prior. FINDINGS: Left rotated chest radiograph. Endotracheal tube tip is 2.5 cm above the carina. Enteric tube enters the stomach with the tip not seen on this image. Stable  cardiomediastinal silhouette with mild cardiomegaly. No pneumothorax. No right pleural effusion. Stable blunting of the left costophrenic angle, likely a small left pleural effusion. Stable volume loss and consolidation at the left lung base. Mild linear parahilar opacities appear stable and likely represent mild pulmonary edema. IMPRESSION: 1. Endotracheal and enteric tubes appear well-positioned. 2. Stable volume loss and consolidation at the left lung base, which could represent a combination of atelectasis, pneumonia and/or lung mass. 3. Likely small left pleural effusion. 4. Stable mild cardiomegaly with mild pulmonary edema. Electronically Signed   By: Delbert Phenix M.D.   On: 09/16/2015 07:38   Portable Chest Xray  09/15/2015  CLINICAL DATA:  Ventilator dependent respiratory failure. EXAM: PORTABLE CHEST 1 VIEW COMPARISON:  Earlier same day FINDINGS: Endotracheal tube has its tip 3 cm above the carina. Nasogastric tube enters the abdomen. Left lower lobe pneumonia/ collapse persists, with collapse being more complete. Right lung well aerated. IMPRESSION: Endotracheal tube and nasogastric tube well positioned. Left lower lobe pneumonia/collapse persists, with collapse being more complete. Electronically Signed   By: Paulina Fusi M.D.   On: 09/15/2015 08:11   Dg Chest Port 1 View  09/15/2015  CLINICAL DATA:  Acute onset of shortness of breath. Initial encounter. EXAM: PORTABLE CHEST 1 VIEW COMPARISON:  Chest radiograph and CTA of the chest from 09/13/2014 FINDINGS: The lungs are well-aerated. Left basilar airspace opacity may reflect pneumonia or asymmetric pulmonary edema. Underlying vascular congestion is noted. A small left pleural effusion is noted. No pneumothorax is seen. The right costophrenic angle is incompletely imaged on this study. The cardiomediastinal silhouette is is mildly enlarged. The patient is status post CABG. No acute osseous abnormalities are seen. IMPRESSION: Left basilar airspace  opacity may reflect pneumonia or asymmetric pulmonary edema. Vascular congestion and mild cardiomegaly. Small left pleural effusion noted. Electronically Signed   By: Roanna Raider M.D.   On: 09/15/2015 04:44   Dg Chest Port 1 View  09/14/2015  CLINICAL DATA:  Acute onset of respiratory distress and shortness of breath. Initial encounter. EXAM: PORTABLE CHEST 1 VIEW COMPARISON:  None. FINDINGS: The lungs are well-aerated. Vascular congestion is noted. Mildly increased interstitial markings may reflect mild interstitial edema. No definite pleural effusion or pneumothorax is  seen. The cardiomediastinal silhouette is mildly enlarged. The patient is status post CABG. No acute osseous abnormalities are seen. IMPRESSION: Vascular congestion and mild cardiomegaly. Mildly increased interstitial markings may reflect mild interstitial edema. Electronically Signed   By: Roanna Raider M.D.   On: 09/14/2015 01:59    ASSESSMENT AND PLAN 1 atrial fibrillation- Patient's heart rate high normal. Continue metoprol. Increase dose if necessary. Now back on warfarin. 2 VDRF-management per CCM 3 Question pneumonia-antibiotics per critical care medicine. 4 left lower lobe mass-will need further evaluation. 5 acute on chronic combined systolic and diastolic congestive heart failure-improved on lasix.  May be able to cut back on lasix. 6 acute renal failure-improved 7. Coronary artery disease-troponins are not consistent with acute coronary syndrome. Troponins show plateau pattern. Can plan nuclear study following discharge. No aspirin long term given need for Coumadin. Continue statin at discharge.   Signed, Ronny Flurry MD

## 2015-09-18 NOTE — Progress Notes (Signed)
PULMONARY / CRITICAL CARE MEDICINE   Name: Lori Harrington MRN: [id number] DOB: 05-11-1930    ADMISSION DATE:  09/14/2015 CONSULTATION DATE: 09/15/15  REFERRING MD : Dr. Konrad Dolores  PRIMARY SERVICE: Critical Care   CHIEF COMPLAINT:  SOB  BRIEF DESCRIPTION:  82 y/o female with CHF, afib admitted with acute on chronic respiratory failure requiring intubation. Patient is on Bipap. As such, history obtained from hospitalist note.   SUBJECTIVE:  Noted to have increased WOB, altered mental status, now awake alert answering yes/no with head movements on bipap  VITAL SIGNS: Temp:  [93.8 F (34.3 C)-99.1 F (37.3 C)] 96.5 F (35.8 C) (01/09 0000) Pulse Rate:  [53-124] 53 (01/09 0600) Resp:  [18-43] 18 (01/09 0600) BP: (119-175)/(56-110) 124/77 mmHg (01/09 0600) SpO2:  [95 %-100 %] 99 % (01/09 0600) FiO2 (%):  [40 %] 40 % (01/09 0600) HEMODYNAMICS:   VENTILATOR SETTINGS: Vent Mode:  [-] BIPAP FiO2 (%):  [40 %] 40 % Set Rate:  [15 bmp] 15 bmp INTAKE / OUTPUT: Intake/Output      01/08 0701 - 01/09 0700 01/09 0701 - 01/10 0700   I.V. (mL/kg)     NG/GT 280    Total Intake(mL/kg) 280 (3.2)    Urine (mL/kg/hr) 1340 (0.6)    Total Output 1340     Net -1060            PHYSICAL EXAMINATION: General: awake on bipap Neuro: awake, alert, follows commands, nodding/shaking head to questions, follows commands HEENT: NCAT, bipap Cardiovascular: irregular rate irregular rhythm, no murmur/rub/gallop Lungs: Rhonchorus R lung fields, diminished on left Abdomen: BS+ soft, nontender.  Musculoskeletal: no pedal edema Skin: thin, no bruising  LABS:  CBC  Recent Labs Lab 09/15/15 0730 09/17/15 0237 09/18/15 0546  WBC 10.7* 11.5* 13.2*  HGB 11.9* 12.4 13.0  HCT 38.2 37.5 39.3  PLT 157 168 PENDING   Coag's  Recent Labs Lab 09/14/15 0108  09/16/15 0218 09/17/15 0655 09/18/15 0546  APTT 36  --   --   --   --   INR 1.76*  < > 3.07* 2.00* 1.87*  < > = values in this interval not  displayed. BMET  Recent Labs Lab 09/15/15 1045 09/16/15 0218 09/17/15 0237  NA 134* 138 140  K 4.1 3.4* 3.4*  CL 92* 95* 96*  CO2 31 32 35*  BUN 40* 43* 36*  CREATININE 1.25* 1.45* 1.13*  GLUCOSE 126* 163* 266*   Electrolytes  Recent Labs Lab 09/15/15 0730 09/15/15 1045 09/15/15 1258 09/16/15 0218 09/17/15 0237  CALCIUM 9.0 8.8*  --  8.7* 8.6*  MG 2.0 2.0  --   --   --   PHOS 4.3  --  2.0*  --   --    Sepsis Markers  Recent Labs Lab 09/14/15 1420 09/15/15 0730 09/15/15 1259 09/17/15 0237  LATICACIDVEN 1.4 1.8 1.5  --   PROCALCITON  --  0.97  --  0.42   ABG  Recent Labs Lab 09/15/15 1103 09/17/15 2340 09/18/15 0100  PHART 7.490* 7.200* 7.315*  PCO2ART 43.3 90.1* 68.2*  PO2ART 114.0* 98.2 98.4   Liver Enzymes  Recent Labs Lab 09/16/15 0218 09/17/15 0237  AST 34 30  ALT 22 20  ALKPHOS 60 59  BILITOT 0.8 1.0  ALBUMIN 3.0* 2.9*   Cardiac Enzymes  Recent Labs Lab 09/15/15 1045 09/15/15 1640 09/15/15 1902  TROPONINI 0.06* 0.06* 0.07*   Glucose  Recent Labs Lab 09/17/15 0805 09/17/15 1145 09/17/15 1615 09/17/15 2050 09/18/15 0118  09/18/15 0439  GLUCAP 234* 201* 185* 259* 278* 210*    Imaging CXR 1/7 reviewed> ett in place, atelectasis left lung base, interstitial infiltrate R lung  ASSESSMENT / PLAN:  PULMONARY A: Acute hypoxic respiratory failure 2/2 CHF exacerbation Acute bronchitis Flail chest, weak on SBT today P:   Bipap, has expressed she does NOT want re-itubation Continue bronchitis treatment Cont albuterol for wheezing Repeat ABG for bipap setting  CARDIOVASCULAR A: CHF exacerbation  > improved w/ diuresis A-fib, Dig d/c'd and BB on HTN HLD P:  cont Lisinopril 10 mg daily   Continue IV Metoprolol 2.5-5 mg q3 prn  Simvastatin 20 mg daily  INR last 1.87, is on day 2 coumadin probably no bridge required Strict I&Os  RENAL A:  Hypokalemia AKI P:   Replace K Diurese for ?effusion, high WOB F/U  Bmet  GASTROINTESTINAL A:  GI ppx, on A/C for Afib P:   Protonix 40 mg daily  Zofran 4 mg q6 prn  Miralax Advance diet per swallow eval once bipap d/c or intermittent  HEMATOLOGIC A:   Thrombocytopenia (chronic)  P:  Monitor CBC Monitor for bleeding  INFECTIOUS A:   Acute bronchitis Viral PCR, Cx neg so far LLL abscess possible ? P:   F/u viral PCR Levaquin monotherapy for now  ENDOCRINE A:   DM2 Hypothyroidism  P:   Continue synthroid Continue SSI Increased coverage now for hyperglycemia  NEUROLOGIC A:   Awake, alert Chronic pain all over Insomnia P:   Episode lethargy 2/2 resp failure Trazodone qhs  Rheumatological  A:  Gout  P: Allopurinol 300 mg qd Colchicine 0.6 mg daily    Fuller Planhristopher W Rice, MD PGY-I Internal Medicine Resident Pager# [phone] 09/18/2015, 8:03 AM   Attending:  I have seen and examined the patient with nurse practitioner/resident and agree with the note above.  My edits are in BOLD above.  We formulated the plan together and I elicited the following history.    Lori Harrington is struggling with dyspnea, increased work of breathing On BIPAP overnight  On exam: Weak, on BIPAP Rhonchi bilaterally, tachypnea CV: irreg irreg, no mgr Belly soft  CXR > unchanged  afib with rvr> IV metoprolol Respiratory failure > worsening, BIPAP, add steroids, sit up in bed, take breaks from BIPAP throughout the day.  Acute bronchitis> add solumedrol Acute pulmonary edema? > diurese today, control BP Hypertension> add nitro/hydralazine Deconditioning> consult PT, nutrition during breaks from BIPAP  Son updated at length 1/9 by phone, he knows that her situation is tenuous.  She is adamant that she doesn't want to go back on the vent and describes it as "torture".  I explained to her and her son that she may not survive this but we will try our best.  My cc time 40 minutes  Heber CarolinaBrent Timoteo Carreiro, MD [facility] Pager: [phone] Cell:  [phone] After 3pm or if no response, call [phone]

## 2015-09-18 NOTE — Progress Notes (Signed)
Notified Residents MD Rabboni and MD Rathore in regards to patient's change in mental status. Patient is more somnolent now than previous assessment. Concerned for patient becoming hypercarbic. Patient was extubated during previous shift. Patient also stated with son at beside that "she did not want to have the breathing tube". Notified Son and he is currently on his way. ABG is being obtained at this moment. Will continue to monitor and assess.  

## 2015-09-19 LAB — GLUCOSE, CAPILLARY
GLUCOSE-CAPILLARY: 200 mg/dL — AB (ref 65–99)
GLUCOSE-CAPILLARY: 236 mg/dL — AB (ref 65–99)
GLUCOSE-CAPILLARY: 240 mg/dL — AB (ref 65–99)
GLUCOSE-CAPILLARY: 277 mg/dL — AB (ref 65–99)
Glucose-Capillary: 220 mg/dL — ABNORMAL HIGH (ref 65–99)
Glucose-Capillary: 317 mg/dL — ABNORMAL HIGH (ref 65–99)

## 2015-09-19 LAB — BASIC METABOLIC PANEL
Anion gap: 16 — ABNORMAL HIGH (ref 5–15)
BUN: 34 mg/dL — AB (ref 6–20)
CHLORIDE: 101 mmol/L (ref 101–111)
CO2: 28 mmol/L (ref 22–32)
Calcium: 9.1 mg/dL (ref 8.9–10.3)
Creatinine, Ser: 1.19 mg/dL — ABNORMAL HIGH (ref 0.44–1.00)
GFR calc Af Amer: 48 mL/min — ABNORMAL LOW (ref >60)
GFR, EST NON AFRICAN AMERICAN: 41 mL/min — AB (ref >60)
GLUCOSE: 254 mg/dL — AB (ref 65–99)
POTASSIUM: 4.8 mmol/L (ref 3.5–5.1)
Sodium: 145 mmol/L (ref 135–145)

## 2015-09-19 LAB — PROTIME-INR
INR: >10 (ref 0.00–1.49)
INR: 2.73 — ABNORMAL HIGH (ref 0.00–1.49)
Prothrombin Time: >90 seconds — ABNORMAL HIGH (ref 11.6–15.2)
Prothrombin Time: 28.5 seconds — ABNORMAL HIGH (ref 11.6–15.2)

## 2015-09-19 MED ORDER — HYDRALAZINE HCL 20 MG/ML IJ SOLN
10.0000 mg | Freq: Four times a day (QID) | INTRAMUSCULAR | Status: DC
  Administered 2015-09-19 (×2): 10 mg via INTRAVENOUS
  Filled 2015-09-19 (×4): qty 1

## 2015-09-19 MED ORDER — WARFARIN SODIUM 2 MG PO TABS
1.0000 mg | ORAL_TABLET | Freq: Once | ORAL | Status: AC
  Administered 2015-09-19: 1 mg via ORAL
  Filled 2015-09-19: qty 1

## 2015-09-19 MED ORDER — CETYLPYRIDINIUM CHLORIDE 0.05 % MT LIQD
7.0000 mL | Freq: Two times a day (BID) | OROMUCOSAL | Status: DC
  Administered 2015-09-19 – 2015-09-22 (×7): 7 mL via OROMUCOSAL

## 2015-09-19 MED ORDER — INSULIN GLARGINE 100 UNIT/ML ~~LOC~~ SOLN
10.0000 [IU] | Freq: Every day | SUBCUTANEOUS | Status: DC
  Administered 2015-09-19: 10 [IU] via SUBCUTANEOUS
  Filled 2015-09-19 (×2): qty 0.1

## 2015-09-19 MED ORDER — FUROSEMIDE 10 MG/ML IJ SOLN
60.0000 mg | Freq: Two times a day (BID) | INTRAMUSCULAR | Status: DC
  Administered 2015-09-19 (×2): 60 mg via INTRAVENOUS
  Filled 2015-09-19: qty 6

## 2015-09-19 MED ORDER — SIMVASTATIN 20 MG PO TABS
20.0000 mg | ORAL_TABLET | Freq: Every day | ORAL | Status: DC
  Administered 2015-09-19 – 2015-09-21 (×3): 20 mg via ORAL
  Filled 2015-09-19 (×4): qty 1

## 2015-09-19 MED ORDER — GUAIFENESIN ER 600 MG PO TB12
1200.0000 mg | ORAL_TABLET | Freq: Two times a day (BID) | ORAL | Status: DC
  Administered 2015-09-19 – 2015-09-22 (×7): 1200 mg via ORAL
  Filled 2015-09-19 (×9): qty 2

## 2015-09-19 MED ORDER — ISOSORB DINITRATE-HYDRALAZINE 20-37.5 MG PO TABS
1.0000 | ORAL_TABLET | Freq: Three times a day (TID) | ORAL | Status: DC
  Administered 2015-09-19 – 2015-09-22 (×10): 1 via ORAL
  Filled 2015-09-19 (×13): qty 1

## 2015-09-19 MED ORDER — RESOURCE THICKENUP CLEAR PO POWD
ORAL | Status: DC | PRN
  Filled 2015-09-19: qty 125

## 2015-09-19 MED ORDER — SODIUM CHLORIDE 0.9 % IV SOLN
INTRAVENOUS | Status: DC
  Administered 2015-09-19 – 2015-09-20 (×2): via INTRAVENOUS

## 2015-09-19 NOTE — Progress Notes (Signed)
Patient Name: Lori Harrington Date of Encounter: 09/19/2015  Active Problems:   Acute exacerbation of CHF (congestive heart failure) (HCC)   Diabetes mellitus, type 2 (HCC)   Acute respiratory failure (HCC)   Gout   Atrial fibrillation (HCC)   Hypothyroid   HTN (hypertension)   Thrombocytopenia (HCC)   Heart failure (HCC)   Acute respiratory failure with hypoxia (HCC)   Acute on chronic congestive heart failure (HCC)   Acute pulmonary edema (HCC)   Acute respiratory failure with hypoxemia (HCC)   Hypokalemia   AKI (acute kidney injury) (HCC)   Muscular deconditioning   Chronic atrial fibrillation [facility])    Primary Cardiologist: Dr. Cassie Freer Patient Profile: 82 y.o. female w/  CAD (s/p CABG 2012 - performed at [facility]), mitral regurgitation (s/p MVR in 2012) ischemic cardiomyopathy (EF 45% by echo in 03/2014), paroxysmal atrial fibrillation (on Coumadin), HTN, HLD, Type 2 DM who presented to [facility] ED on 09/14/2015 for worsening shortness of breath starting the previous day. Cards consulted on 09/15/2015  SUBJECTIVE: Reports her breathing has improved. Off BiPAP, currently on 6L [place]. Reports palpitations at times. Denies any chest pain.  OBJECTIVE Filed Vitals:   09/19/15 0600 09/19/15 0700 09/19/15 0800 09/19/15 0900  BP: 150/94 144/82 141/87 163/79  Pulse: 98 89 89 108  Temp:  98.1 F (36.7 C)    TempSrc:  Oral    Resp: 23 19 20 30   Height:      Weight:      SpO2: 100% 100% 100% 100%    Intake/Output Summary (Last 24 hours) at 09/19/15 1004 Last data filed at 09/19/15 0900  Gross per 24 hour  Intake    483 ml  Output   2810 ml  Net  -2327 ml   Filed Weights   09/16/15 0500 09/17/15 0450 09/19/15 0500  Weight: 202 lb 9.6 oz (91.9 kg) 195 lb 12.3 oz (88.8 kg) 187 lb 2.7 oz (84.9 kg)    PHYSICAL EXAM General: Well developed, well nourished, female in no acute distress. Head: Normocephalic, atraumatic.  Neck: Supple without bruits, JVD slightly  elevated. Lungs:  Resp regular and unlabored, Rhonchi throughout. Rales at bases bilaterally. Heart: Irregularly irregular, S1, S2, no S3, S4, or murmur; no rub. Abdomen: Soft, non-tender, non-distended with normoactive bowel sounds. No hepatomegaly. No rebound/guarding. No obvious abdominal masses. Extremities: No clubbing, cyanosis, or edema. Distal pedal pulses are 2+ bilaterally. Neuro: Alert and oriented X 3. Moves all extremities spontaneously. Psych: Normal affect.   LABS: CBC: Recent Labs  09/17/15 0237 09/18/15 0546  WBC 11.5* 13.2*  NEUTROABS 7.3  --   HGB 12.4 13.0  HCT 37.5 39.3  MCV 87.0 88.7  PLT 168 390   INR: Recent Labs  09/19/15 0445  INR 2.73*   Basic Metabolic Panel: Recent Labs  09/18/15 0734 09/19/15 0222  NA 146* 145  K 3.9 4.8  CL 100* 101  CO2 36* 28  GLUCOSE 196* 254*  BUN 30* 34*  CREATININE 1.15* 1.19*  CALCIUM 8.9 9.1   Liver Function Tests: Recent Labs  09/17/15 0237  AST 30  ALT 20  ALKPHOS 59  BILITOT 1.0  PROT 6.1*  ALBUMIN 2.9*  BNP:  B NATRIURETIC PEPTIDE  Date/Time Value Ref Range Status  09/15/2015 07:30 AM 381.9* 0.0 - 100.0 pg/mL Final  09/14/2015 01:08 AM 746.6* 0.0 - 100.0 pg/mL Final    TELE:  Atrial fibrillation with rates in 90's - 120's. Peaking into 150's.  ECHO: 09/14/2015 Study Conclusions - Left ventricle: Inferior and septal hypokinesis. The cavity size was mildly dilated. Wall thickness was normal. Systolic function was mildly to moderately reduced. The estimated ejection fraction was in the range of 40% to 45%. - Aortic valve: There was mild stenosis. There was trivial regurgitation. Valve area (VTI): 2.51 cm^2. Valve area (Vmax): 2.55 cm^2. Valve area (Vmean): 2.46 cm^2. - Mitral valve: ? Previous MV repair with trivial residual MR. Valve area by continuity equation (using LVOT flow): 2.21 cm^2. - Left atrium: The atrium was moderately dilated. - Right ventricle: The cavity  size was mildly dilated. - Right atrium: The atrium was mildly to moderately dilated. - Atrial septum: No defect or patent foramen ovale was identified. - Pulmonary arteries: PA peak pressure: 45 mm Hg (S).  Radiology/Studies: Dg Chest Port 1 View: 09/18/2015  CLINICAL DATA:  Dyspnea. EXAM: PORTABLE CHEST 1 VIEW COMPARISON:  Most recent radiographs yesterday at 0452 hour. Chest CT 09/14/2015 FINDINGS: Patient has been extubated. Significant rotation to the right. Volume loss at the left lung base with dense basilar opacity, unchanged. Probable adjacent pleural effusion. Cardiomegaly is grossly stable. Interstitial edema has progressed. A skin fold projects over the right hemithorax. No pneumothorax. IMPRESSION: 1. Persistent volume loss in the left hemithorax with dense left basilar opacity, atelectasis/collapse versus pneumonia. Probable left pleural effusion. 2. Grossly stable cardiomegaly.  Mild increased interstitial edema. Electronically Signed   By: Rubye Oaks M.D.   On: 09/18/2015 01:27     Current Medications:  . antiseptic oral rinse  7 mL Mouth Rinse BID  . furosemide  60 mg Intravenous BID  . hydrALAZINE  10 mg Intravenous 4 times per day  . insulin aspart  0-15 Units Subcutaneous 6 times per day  . levofloxacin (LEVAQUIN) IV  750 mg Intravenous Q48H  . levothyroxine  50 mcg Intravenous Daily  . methylPREDNISolone (SOLU-MEDROL) injection  40 mg Intravenous Q12H  . metoprolol  5 mg Intravenous 4 times per day  . nitroGLYCERIN  0.5 inch Topical 4 times per day  . sodium chloride  3 mL Intravenous Q12H  . Warfarin - Pharmacist Dosing Inpatient   Does not apply q1800   . sodium chloride      ASSESSMENT AND PLAN:  1. Paroxysmal Atrial Fibrillation - This patients CHA2DS2-VASc Score and unadjusted Ischemic Stroke Rate (% per year) is equal to 11.2 % stroke rate/year from a score of 7 (CHF, HTN, DM, Vascular, Female, Age > 75 (2)). Continue Coumadin for anticoagulation. -  Patient's HR still elevated, especially with activity such as sitting up on the side of bed with PT. Would increase frequency of IV Metoprolol dosing until patient can take PO medications.   2. VDRF - management per CCM  3. Questionable pneumonia -antibiotics per critical care medicine.  4. Left lower lobe mass -will need further evaluation as an outpatient.  5.  Acute on chronic combined systolic and diastolic congestive heart failure - net output -11.6L this admission.  - agree with decreasing Lasix dosage to 60mg  IV BID. (Was on Lasix 40mg  in AM, 20mg  in PM prior to admission).  6. Acute renal failure - peaked at 1.45 on 09/16/2015. Stable at 1.19 on 09/18/2014.  7. Coronary artery disease - history of CAD, s/p CABG in 2012, performed at [facility] - troponins are not consistent with acute coronary syndrome. Troponins show plateau pattern, with peak of 0.07.  - recommended NST as outpatient.  Signed, Ellsworth Lennox , PA-C 10:04 AM 09/19/2015  Pager: [phone] Clinically much improved. Now on nasal oxygen, oxygenating well.Denies chest pain or dyspnea. Rhythm reveals atrial fibrillation with rapid VR 115.  BP stable. She is currently taking ice chips. Anticipate she will be able to start po meds soon.

## 2015-09-19 NOTE — Progress Notes (Signed)
ANTICOAGULATION CONSULT NOTE - Follow Up Consult  Pharmacy Consult for warfarin Indication: atrial fibrillation  Allergies  Allergen Reactions  . Adhesive [Tape]     Skin turns red and itching   . Codeine     Stop breathing   . Oxycodone     unknown    Patient Measurements: Height: 5\' 9"  (175.3 cm) Weight: 187 lb 2.7 oz (84.9 kg) IBW/kg (Calculated) : 66.2  Vital Signs: Temp: 98.1 F (36.7 C) (01/10 0700) Temp Source: Oral (01/10 0700) BP: 165/81 mmHg (01/10 1000) Pulse Rate: 98 (01/10 1000)  Labs:  Recent Labs  09/17/15 0237  09/18/15 0546 09/18/15 0734 09/19/15 0222 09/19/15 0445  HGB 12.4  --  13.0  --   --   --   HCT 37.5  --  39.3  --   --   --   PLT 168  --  390  --   --   --   LABPROT  --   < > 21.4*  --  >90.0* 28.5*  INR  --   < > 1.87*  --  >10.00* 2.73*  CREATININE 1.13*  --   --  1.15* 1.19*  --   < > = values in this interval not displayed.  Estimated Creatinine Clearance: 41.7 mL/min (by C-G formula based on Cr of 1.19).   Medications:  Prescriptions prior to admission  Medication Sig Dispense Refill Last Dose  . allopurinol (ZYLOPRIM) 300 MG tablet Take 300 mg by mouth daily.   09/13/2015 at Unknown time  . Cholecalciferol (VITAMIN D-3) 1000 units CAPS Take 1 capsule by mouth daily.   09/14/2015 at Unknown time  . colchicine 0.6 MG tablet Take 0.6 mg by mouth daily as needed. For gout   Past Week at Unknown time  . digoxin (LANOXIN) 0.125 MG tablet Take 0.125 mg by mouth daily.    09/13/2015 at Unknown time  . docusate sodium (COLACE) 100 MG capsule Take 100 mg by mouth daily.    09/13/2015 at Unknown time  . furosemide (LASIX) 20 MG tablet Take 20 mg by mouth See admin instructions. Son states she takes bid then the next day she will take tid   09/13/2015 at Unknown time  . gabapentin (NEURONTIN) 300 MG capsule Take 300 mg by mouth 3 (three) times daily.   09/13/2015 at Unknown time  . insulin glargine (LANTUS) 100 UNIT/ML injection Inject 40-44 Units into  the skin at bedtime.   09/13/2015 at Unknown time  . Insulin Lispro (HUMALOG KWIKPEN [place]) Inject 20-30 units of lipase into the skin See admin instructions. Take 30 units at breakfast, 20 units at lunch, 28 units at supper per sliding scale   09/13/2015 at Unknown time  . levofloxacin (LEVAQUIN) 500 MG tablet Take 500 mg by mouth daily. Take for 10 days   09/13/2015 at Unknown time  . levothyroxine (SYNTHROID, LEVOTHROID) 125 MCG tablet Take 125 mcg by mouth daily before breakfast.   09/13/2015 at Unknown time  . lisinopril (PRINIVIL,ZESTRIL) 10 MG tablet Take 10 mg by mouth daily.   09/13/2015 at Unknown time  . metFORMIN (GLUCOPHAGE) 500 MG tablet Take 500 mg by mouth 2 (two) times daily with a meal.   09/13/2015 at Unknown time  . metoprolol (LOPRESSOR) 50 MG tablet Take 50 mg by mouth 2 (two) times daily.   09/13/2015 at 0800  . ondansetron (ZOFRAN) 4 MG tablet Take 4 mg by mouth every 8 (eight) hours as needed for nausea or vomiting.  unknwon at unknown  . potassium chloride SA (K-DUR,KLOR-CON) 20 MEQ tablet Take 20 mEq by mouth 2 (two) times daily.   09/13/2015 at Unknown time  . simvastatin (ZOCOR) 20 MG tablet Take 20 mg by mouth at bedtime.   09/13/2015 at Unknown time  . traMADol (ULTRAM) 50 MG tablet Take 50 mg by mouth 3 (three) times daily.   09/13/2015 at Unknown time  . vitamin B-12 (CYANOCOBALAMIN) 1000 MCG tablet Take 1,000 mcg by mouth daily.   09/13/2015 at Unknown time  . warfarin (COUMADIN) 4 MG tablet Take 4 mg by mouth daily.   09/13/2015 at Unknown time    Assessment: 82 yo F admitted 09/14/2015 with SOB and respiratory distress on warfarin PTA for Afib. Warfarin was held on admission due to INR 3.07, restarted warfarin on 1/8.   PTA warfarin: 4 mg daily  INR 2.73 (initially showed >10 repeated and verified as 2.73), H/H stable, Plt 390, No s/sx of bleeding noted.  Goal of Therapy:  INR 2-3 Monitor platelets by anticoagulation protocol: Yes   Plan:  - Give warfarin 1 mg x1 tonight -  Monitor daily INR, CBC and s/sx of bleeding  Casilda Carls, PharmD. PGY-1 Pharmacy Resident Pager: [phone]  09/19/2015,11:18 AM

## 2015-09-19 NOTE — Progress Notes (Signed)
Patient is refusing BIPAP at this time stating she does not feel as though she needs it at this time. All vitals are stable and patient is in no distress at this time. Will monitor as needed.

## 2015-09-19 NOTE — Progress Notes (Signed)
eLink Physician-Brief Progress Note Patient Name: Lori RailJoyce A Stegmann DOB: [birth year] MRN: [id number]   Date of Service  09/19/2015  HPI/Events of Note  Spoke with son, Laroy AppleChuck Villarin, concerning the transfer of his mother to a stepdown bed. He is opposed to moving her d/t several factors: 1. He spoke with physicians caring for her today who related how sick she is and he fears a decompensation if she is moved from the ICU at this time. 2. He fears that she will have a panic attack that may precipitate a decompensation if she is moved without a family member present.   eICU Interventions  Will leave patient in ICU for the present time and defer any transfer to the primary physicians in the AM.      Intervention Category Minor Interventions: Communication with other healthcare providers and/or family  Lenell AntuSommer,Javoni Lucken [place] 09/19/2015, 2:18 AM

## 2015-09-19 NOTE — Progress Notes (Signed)
PULMONARY / CRITICAL CARE MEDICINE   Name: Lori Harrington MRN: [id number] DOB: 1929-10-25    ADMISSION DATE:  09/14/2015 CONSULTATION DATE: 09/15/15  REFERRING MD : Dr. Konrad Dolores  PRIMARY SERVICE: Critical Care   CHIEF COMPLAINT:  SOB  BRIEF DESCRIPTION:  82 y/o female with CHF, afib admitted with acute on chronic respiratory failure requiring intubation. Suspected that acute bronchitis is underlying trigger for exacerbation.  SUBJECTIVE:  Work of breathing decreased from yesterday, tolerating off bipap all night and this morning with good mental status, continues weak cough productive of thick mucous  VITAL SIGNS: Temp:  [98 F (36.7 C)-99.4 F (37.4 C)] 98.3 F (36.8 C) (01/10 0330) Pulse Rate:  [50-121] 98 (01/10 0600) Resp:  [15-42] 23 (01/10 0600) BP: (130-176)/(75-99) 150/94 mmHg (01/10 0600) SpO2:  [97 %-100 %] 100 % (01/10 0600) FiO2 (%):  [40 %] 40 % (01/09 1508) Weight:  [84.9 kg (187 lb 2.7 oz)] 84.9 kg (187 lb 2.7 oz) (01/10 0500) HEMODYNAMICS:   VENTILATOR SETTINGS: Vent Mode:  [-] BIPAP FiO2 (%):  [40 %] 40 % Set Rate:  [15 bmp] 15 bmp PEEP:  [6 cmH20] 6 cmH20 INTAKE / OUTPUT: Intake/Output      01/09 0701 - 01/10 0700 01/10 0701 - 01/11 0700   I.V. (mL/kg) 3 (0)    NG/GT     IV Piggyback 400    Total Intake(mL/kg) 403 (4.7)    Urine (mL/kg/hr) 2735 (1.3)    Total Output 2735     Net -2332            PHYSICAL EXAMINATION: General: awake, alert, NAD Neuro: Follows all commands, conversive HEENT: NCAT, moist oral mucosa, JVD present Cardiovascular: irregular rate irregular rhythm, no murmur/rub/gallop Lungs: Crackle at bases, fair air movement now on right Abdomen: BS+ soft, nontender.  Musculoskeletal: no pedal edema Skin: thin, no bruising  LABS:  CBC  Recent Labs Lab 09/15/15 0730 09/17/15 0237 09/18/15 0546  WBC 10.7* 11.5* 13.2*  HGB 11.9* 12.4 13.0  HCT 38.2 37.5 39.3  PLT 157 168 390   Coag's  Recent Labs Lab 09/14/15 0108   09/18/15 0546 09/19/15 0222 09/19/15 0445  APTT 36  --   --   --   --   INR 1.76*  < > 1.87* >10.00* 2.73*  < > = values in this interval not displayed. BMET  Recent Labs Lab 09/17/15 0237 09/18/15 0734 09/19/15 0222  NA 140 146* 145  K 3.4* 3.9 4.8  CL 96* 100* 101  CO2 35* 36* 28  BUN 36* 30* 34*  CREATININE 1.13* 1.15* 1.19*  GLUCOSE 266* 196* 254*   Electrolytes  Recent Labs Lab 09/15/15 0730 09/15/15 1045 09/15/15 1258  09/17/15 0237 09/18/15 0734 09/19/15 0222  CALCIUM 9.0 8.8*  --   < > 8.6* 8.9 9.1  MG 2.0 2.0  --   --   --   --   --   PHOS 4.3  --  2.0*  --   --   --   --   < > = values in this interval not displayed. Sepsis Markers  Recent Labs Lab 09/14/15 1420 09/15/15 0730 09/15/15 1259 09/17/15 0237 09/18/15 0734  LATICACIDVEN 1.4 1.8 1.5  --   --   PROCALCITON  --  0.97  --  0.42 0.26   ABG  Recent Labs Lab 09/17/15 2340 09/18/15 0100 09/18/15 0820  PHART 7.200* 7.315* 7.438  PCO2ART 90.1* 68.2* 55.5*  PO2ART 98.2 98.4 119*  Liver Enzymes  Recent Labs Lab 09/16/15 0218 09/17/15 0237  AST 34 30  ALT 22 20  ALKPHOS 60 59  BILITOT 0.8 1.0  ALBUMIN 3.0* 2.9*   Cardiac Enzymes  Recent Labs Lab 09/15/15 1045 09/15/15 1640 09/15/15 1902  TROPONINI 0.06* 0.06* 0.07*   Glucose  Recent Labs Lab 09/18/15 0831 09/18/15 1123 09/18/15 1524 09/18/15 1947 09/18/15 2345 09/19/15 0330  GLUCAP 178* 163* 241* 241* 236* 220*    Imaging CXR 1/9 reviewed> atelectasis left lung base with effusion/volume loss, interstitial edema persists  ASSESSMENT / PLAN:  PULMONARY A: Acute hypoxic respiratory failure 2/2 CHF exacerbation Acute bronchitis Flail chest, weak on SBT today P:   Bipap, has expressed she does NOT want re-itubation Continue bronchitis treatment Cont albuterol for wheezing Significant improvement with methylprednisolone, continue  CARDIOVASCULAR A: CHF exacerbation  > improved w/ diuresis A-fib, Dig  d/c'd and BB on HTN HLD P:  Held Lisinopril 10 mg daily with AKI, NPO Continue IV Metoprolol 2.5-5 mg q6 advance to PO if no longer NPO Nitro and hydralazine q6 Simvastatin 20 mg daily  Back to coumadin Strict I&Os  RENAL A:  Hypokalemia, resolved AKI P:   Hold K replacement Decrease diuresis for F/U Bmet  GASTROINTESTINAL A:  GI ppx, on A/C for Afib P:   Protonix 40 mg daily  Zofran 4 mg q6 prn  Miralax Advance diet per swallow eval now off bipap  HEMATOLOGIC A:   Thrombocytopenia (chronic)  Isolated INR >10 probably spurious P:  Monitor CBC Monitor for bleeding  INFECTIOUS A:   Acute bronchitis Viral PCR, Cx neg so far LLL abscess possible ? P:   F/u viral PCR Levaquin monotherapy for now  ENDOCRINE A:   DM2 Hypothyroidism  P:   Continue synthroid Continue +SSI  NEUROLOGIC A:   Awake, alert Chronic pain all over LE neuropathy Insomnia P:   Maintaining good mental status on nasal cannula, continue Trazodone qhs  Rheumatological  A:  Gout  P: Holding allopurinol pending swallow Not active complaint indicating for colchicine   Fuller Planhristopher W Geanie Pacifico, MD PGY-I Internal Medicine Resident Pager# [phone] 09/19/2015, 8:03 AM

## 2015-09-19 NOTE — Evaluation (Signed)
Clinical/Bedside Swallow Evaluation Patient Details  Name: Lori Harrington MRN: [id number] Date of Birth: 02-13-1930  Today's Date: 09/19/2015 Time: SLP Start Time (ACUTE ONLY): 0820 SLP Stop Time (ACUTE ONLY): 0840 SLP Time Calculation (min) (ACUTE ONLY): 20 min  Past Medical History:  Past Medical History  Diagnosis Date  . CHF (congestive heart failure) (HCC)   . Anemia   . Mitral valve disorder   . Atrial fibrillation (HCC)   . Pure hypercholesterolemia   . Proteinuria    Past Surgical History:  Past Surgical History  Procedure Laterality Date  . Abdominal hysterectomy    . Cardiac surgery    . Total knee arthroplasty Left    HPI:  82 y/o female with CHF, afib admitted with acute on chronic respiratory failure requiring intubation. Intubated 1/6-1/8. Difficulty swallowing pills post extubation.    Assessment / Plan / Recommendation Clinical Impression  Pt demonstrates signs of decreased airway protection, likely due to recovery following three day intubation vs increased respiratory rate and decreased apneic period during the swallow. Pt coughs exclusively and consistently following each sip of water. Cough eliminated with nectar thick liquids. Expect resolution of dysphagia over the short term as RR and time post extubation improves. Will follow for tolerance/upgrade from Dys 3/nectar thick liquids or need for MBS is improvement not observed at bedside.     Aspiration Risk  Moderate aspiration risk    Diet Recommendation Dysphagia 3 (Mech soft);Nectar-thick liquid   Liquid Administration via: Cup;Straw Medication Administration: Whole meds with puree Supervision: Patient able to self feed Compensations: Slow rate;Small sips/bites Postural Changes: Seated upright at 90 degrees    Other  Recommendations Oral Care Recommendations: Oral care BID Other Recommendations: Order thickener from pharmacy   Follow up Recommendations  None    Frequency and Duration min 2x/week   2 weeks       Prognosis        Swallow Study   General HPI: 82 y/o female with CHF, afib admitted with acute on chronic respiratory failure requiring intubation. Intubated 1/6-1/8. Difficulty swallowing pills post extubation.  Type of Study: Bedside Swallow Evaluation Diet Prior to this Study: NPO Temperature Spikes Noted: No Respiratory Status: Nasal cannula History of Recent Intubation: Yes Length of Intubations (days): 3 days Date extubated: 09/17/15 Behavior/Cognition: Alert;Cooperative;Pleasant mood Oral Cavity Assessment: Within Functional Limits Oral Care Completed by SLP: No Oral Cavity - Dentition: Adequate natural dentition Vision: Functional for self-feeding Self-Feeding Abilities: Able to feed self Patient Positioning: Upright in bed Baseline Vocal Quality: Normal Volitional Cough: Congested Volitional Swallow: Able to elicit    Oral/Motor/Sensory Function Overall Oral Motor/Sensory Function: Within functional limits   Ice Chips     Thin Liquid Thin Liquid: Impaired Presentation: Cup Pharyngeal  Phase Impairments: Cough - Immediate;Throat Clearing - Immediate    Nectar Thick Nectar Thick Liquid: Within functional limits   Honey Thick     Puree Puree: Within functional limits   Solid   GO    Solid: Within functional limits      [facility] PhiladeLPhiaBonnie Daleisa Halperin, MA CCC-SLP [phone]  Buddy Loeffelholz, [name] 09/19/2015,8:53 AM

## 2015-09-19 NOTE — Progress Notes (Signed)
Inpatient Diabetes Program Recommendations  AACE/ADA: New Consensus Statement on Inpatient Glycemic Control (2015)  Target Ranges:  Prepandial:   less than 140 mg/dL      Peak postprandial:   less than 180 mg/dL (1-2 hours)      Critically ill patients:  140 - 180 mg/dL   Review of Glycemic Control  Inpatient Diabetes Program Recommendations:  Insulin - Basal: add Lantus or Levemir 10 units  Thank you  Piedad ClimesGina Taqwa Deem BSN, RN,CDE Inpatient Diabetes Coordinator [phone] (team pager)

## 2015-09-19 NOTE — Evaluation (Signed)
Physical Therapy Evaluation Patient Details Name: Lori Harrington MRN: [id number] DOB: 12-11-1929 Today's Date: 09/19/2015   History of Present Illness  Patient is a 82 y/o female with hx of CHF, mitral valve disorder and A-fib admitted with acute on chronic respiratory failure requiring intubation. Intubated 1/6-1/8  Clinical Impression  Patient presents with functional limitations due to deficits listed in PT problem list (see below). Pt with decreased strength, endurance, mobility, and impaired respiratory status impacting functional independence. Pt from home alone and Mod I PTA. Has support from son but he works during the day. Tolerated standing and taking a few steps along side bed with assist of 2 due to weakness. Would benefit from ST SNF to maximize independence and mobility prior to return home.    Follow Up Recommendations SNF;Supervision/Assistance - 24 hour    Equipment Recommendations  None recommended by PT    Recommendations for Other Services OT consult     Precautions / Restrictions Precautions Precautions: Fall Restrictions Weight Bearing Restrictions: No      Mobility  Bed Mobility Overal bed mobility: Needs Assistance Bed Mobility: Supine to Sit;Sit to Sidelying     Supine to sit: Mod assist;HOB elevated   Sit to sidelying: Min assist General bed mobility comments: Increased times and cues for technique. Assist to mobilize BLEs to EOB and elevate trunk. Assist to bring BLEs into sidelying and for repositioning.  Transfers Overall transfer level: Needs assistance Equipment used: 2 person hand held assist Transfers: Sit to/from Stand Sit to Stand: Mod assist;+2 physical assistance         General transfer comment: Assist of 2 to rise from EOB with cues for anterior translation. Unsteady on feet. Cues for upright.  Ambulation/Gait Ambulation/Gait assistance: Mod assist Ambulation Distance (Feet): 3 Feet Assistive device: 2 person hand held  assist Gait Pattern/deviations: Step-to pattern   Gait velocity interpretation: Below normal speed for age/gender General Gait Details: Able to side step along side bed with assist for balance due to instability in BLEs. HR up to 147 bpm.  Stairs            Wheelchair Mobility    Modified Rankin (Stroke Patients Only)       Balance Overall balance assessment: Needs assistance Sitting-balance support: Feet supported;No upper extremity supported Sitting balance-Leahy Scale: Fair Sitting balance - Comments: Able to sit unsupported while feeding self ice and performing AROM BLEs without LOB.   Standing balance support: During functional activity Standing balance-Leahy Scale: Poor Standing balance comment: Requires UE support for static and dynamic standing balance.                              Pertinent Vitals/Pain Pain Assessment: No/denies pain    Home Living Family/patient expects to be discharged to:: Private residence Living Arrangements: Alone Available Help at Discharge: Family;Available PRN/intermittently (Son assist but works during the day for [employer]) Type of Home: House Home Access: Ramped entrance     Home Layout: One level Home Equipment: Environmental consultantWalker - 2 wheels;Cane - single point      Prior Function Level of Independence: Independent with assistive device(s)         Comments: Uses RW for community ambulation and is furniture walker at baseline.     Hand Dominance   Dominant Hand: Right    Extremity/Trunk Assessment   Upper Extremity Assessment: Defer to OT evaluation  Lower Extremity Assessment: RLE deficits/detail;LLE deficits/detail      Cervical / Trunk Assessment: Kyphotic  Communication   Communication: No difficulties  Cognition Arousal/Alertness: Awake/alert Behavior During Therapy: WFL for tasks assessed/performed Overall Cognitive Status: Within Functional Limits for tasks assessed                       General Comments General comments (skin integrity, edema, etc.): Pt removed 02 to blow nose and SP02 dropped to 87%. Placed back on 5L/min 02.    Exercises        Assessment/Plan    PT Assessment Patient needs continued PT services  PT Diagnosis Difficulty walking;Generalized weakness   PT Problem List Decreased strength;Cardiopulmonary status limiting activity;Impaired sensation;Decreased activity tolerance;Decreased balance;Decreased mobility  PT Treatment Interventions Balance training;Functional mobility training;Therapeutic activities;Therapeutic exercise;Patient/family education;Gait training   PT Goals (Current goals can be found in the Care Plan section) Acute Rehab PT Goals Patient Stated Goal: to get better and go home PT Goal Formulation: With patient Time For Goal Achievement: 10/03/15 Potential to Achieve Goals: Fair    Frequency Min 3X/week   Barriers to discharge Decreased caregiver support Lives alone    Co-evaluation               End of Session Equipment Utilized During Treatment: Gait belt;Oxygen Activity Tolerance: Patient tolerated treatment well;Treatment limited secondary to medical complications (Comment) (elevated HR in A-fib) Patient left: in bed;with call bell/phone within reach;with bed alarm set Nurse Communication: Mobility status         Time: 1610-9604 PT Time Calculation (min) (ACUTE ONLY): 28 min   Charges:   PT Evaluation $PT Eval Moderate Complexity: 1 Procedure PT Treatments $Therapeutic Activity: 8-22 mins   PT G Codes:        Heidemarie Goodnow A Asalee Barrette 09/19/2015, 11:28 AM  Mylo Red, PT, DPT [phone]

## 2015-09-20 DIAGNOSIS — N189 Chronic kidney disease, unspecified: Secondary | ICD-10-CM

## 2015-09-20 DIAGNOSIS — J209 Acute bronchitis, unspecified: Secondary | ICD-10-CM | POA: Diagnosis present

## 2015-09-20 DIAGNOSIS — I5042 Chronic combined systolic (congestive) and diastolic (congestive) heart failure: Secondary | ICD-10-CM | POA: Diagnosis present

## 2015-09-20 DIAGNOSIS — D696 Thrombocytopenia, unspecified: Secondary | ICD-10-CM

## 2015-09-20 DIAGNOSIS — N179 Acute kidney failure, unspecified: Secondary | ICD-10-CM | POA: Diagnosis not present

## 2015-09-20 DIAGNOSIS — E039 Hypothyroidism, unspecified: Secondary | ICD-10-CM

## 2015-09-20 DIAGNOSIS — M1000 Idiopathic gout, unspecified site: Secondary | ICD-10-CM

## 2015-09-20 LAB — BASIC METABOLIC PANEL
ANION GAP: 12 (ref 5–15)
BUN: 56 mg/dL — AB (ref 6–20)
CALCIUM: 9.3 mg/dL (ref 8.9–10.3)
CHLORIDE: 95 mmol/L — AB (ref 101–111)
CO2: 36 mmol/L — AB (ref 22–32)
CREATININE: 1.5 mg/dL — AB (ref 0.44–1.00)
GFR calc non Af Amer: 31 mL/min — ABNORMAL LOW (ref >60)
GFR, EST AFRICAN AMERICAN: 36 mL/min — AB (ref >60)
Glucose, Bld: 256 mg/dL — ABNORMAL HIGH (ref 65–99)
Potassium: 4.6 mmol/L (ref 3.5–5.1)
Sodium: 143 mmol/L (ref 135–145)

## 2015-09-20 LAB — GLUCOSE, CAPILLARY
GLUCOSE-CAPILLARY: 244 mg/dL — AB (ref 65–99)
GLUCOSE-CAPILLARY: 263 mg/dL — AB (ref 65–99)
GLUCOSE-CAPILLARY: 209 mg/dL — AB (ref 65–99)
Glucose-Capillary: 180 mg/dL — ABNORMAL HIGH (ref 65–99)
Glucose-Capillary: 214 mg/dL — ABNORMAL HIGH (ref 65–99)
Glucose-Capillary: 214 mg/dL — ABNORMAL HIGH (ref 65–99)
Glucose-Capillary: 288 mg/dL — ABNORMAL HIGH (ref 65–99)
Glucose-Capillary: 303 mg/dL — ABNORMAL HIGH (ref 65–99)

## 2015-09-20 LAB — PROTIME-INR
INR: 4.24 — ABNORMAL HIGH (ref 0.00–1.49)
Prothrombin Time: 39.7 seconds — ABNORMAL HIGH (ref 11.6–15.2)

## 2015-09-20 MED ORDER — HYDRALAZINE HCL 20 MG/ML IJ SOLN: 10.0000 mg | Freq: Four times a day (QID) | INTRAMUSCULAR | Status: DC | PRN

## 2015-09-20 MED ORDER — INSULIN GLARGINE 100 UNIT/ML ~~LOC~~ SOLN
20.0000 [IU] | Freq: Every day | SUBCUTANEOUS | Status: DC
  Administered 2015-09-20: 20 [IU] via SUBCUTANEOUS
  Filled 2015-09-20 (×2): qty 0.2

## 2015-09-20 MED ORDER — GLUCERNA SHAKE PO LIQD
237.0000 mL | Freq: Three times a day (TID) | ORAL | Status: DC
  Administered 2015-09-20 – 2015-09-22 (×6): 237 mL via ORAL

## 2015-09-20 MED ORDER — LEVOTHYROXINE SODIUM 100 MCG IV SOLR
62.5000 ug | Freq: Every day | INTRAVENOUS | Status: DC
  Administered 2015-09-20: 62.5 ug via INTRAVENOUS
  Filled 2015-09-20 (×3): qty 5

## 2015-09-20 MED ORDER — FUROSEMIDE 40 MG PO TABS
60.0000 mg | ORAL_TABLET | Freq: Two times a day (BID) | ORAL | Status: DC
  Administered 2015-09-20 – 2015-09-21 (×3): 60 mg via ORAL
  Filled 2015-09-20 (×4): qty 1

## 2015-09-20 MED ORDER — METOPROLOL SUCCINATE ER 25 MG PO TB24
25.0000 mg | ORAL_TABLET | Freq: Two times a day (BID) | ORAL | Status: DC
  Administered 2015-09-20 – 2015-09-22 (×5): 25 mg via ORAL
  Filled 2015-09-20 (×6): qty 1

## 2015-09-20 MED ORDER — GUAIFENESIN 100 MG/5ML PO SOLN
5.0000 mL | ORAL | Status: DC | PRN
  Administered 2015-09-20 – 2015-09-22 (×3): 100 mg via ORAL
  Filled 2015-09-20 (×4): qty 5

## 2015-09-20 NOTE — Progress Notes (Signed)
Nutrition Follow-up  DOCUMENTATION CODES:   Obesity unspecified  INTERVENTION:  Provide Glucerna Shake po TID, each supplement provides 220 kcal and 10 grams of protein Provide snacks BID (applesauce and yogurt)   NUTRITION DIAGNOSIS:   Inadequate oral intake related to poor appetite as evidenced by meal completion < 50%.  Ongoing  GOAL:   Patient will meet greater than or equal to 90% of their needs  Unmet  MONITOR:   PO intake, Supplement acceptance, Skin, Labs, I & O's  REASON FOR ASSESSMENT:   Consult Enteral/tube feeding initiation and management  ASSESSMENT:   82 year old never smoker with CHF, admitted 1/5 with cough and sinusitis and respiratory distress, transferred to ICU for increased obtundation and BiPAP. Required intubation on 1/6.  Pt was extubated on 1/8 and advanced to a Dysphagia 3 diet 1/10; she is now on a Carb Modified diet. Per nursing notes, pt is eating 25-40% of meals. Pt reports eating well PTA. She now has a poor appetite and continues to have some difficulty swallowing due to sore throat; she feels very weak. Pt reports that she usually weighs 225 lbs and has lost weight. Per daughter, pt had significant edema when she weighed 225 lbs. Pt is agreeable to receiving Glucerna Shakes and snacks to improve nutrient intake/nutrition status. RD spoke with pt's daughter regarding her questions about the carb modified diet. Assisted patient and daughter in planning pt meals.  Labs: elevated glucose, high INR, low chloride  Diet Order:  Diet Carb Modified Fluid consistency:: Thin; Room service appropriate?: Yes  Skin:  Reviewed, no issues  Last BM:  1/11  Height:   Ht Readings from Last 1 Encounters:  09/19/15 5' 9.5" (1.765 m)    Weight:   Wt Readings from Last 1 Encounters:  09/20/15 190 lb 1.6 oz (86.229 kg)    Ideal Body Weight:  65.9 kg  BMI:  Body mass index is 27.68 kg/(m^2).  Estimated Nutritional Needs:   Kcal:   1600-1800  Protein:  90-100 grams   Fluid:  1.6-1.8 L/day  EDUCATION NEEDS:   No education needs identified at this time  Lori Ogleeanne Guillermo Harrington RD, LDN Inpatient Clinical Dietitian Pager: [phone] After Hours Pager: [phone]

## 2015-09-20 NOTE — Progress Notes (Signed)
Patient Name: Lori Harrington Date of Encounter: 09/20/2015     Active Problems:   Acute exacerbation of CHF (congestive heart failure) (HCC)   Diabetes mellitus, type 2 (HCC)   Acute respiratory failure (HCC)   Gout   Atrial fibrillation (HCC)   Hypothyroid   HTN (hypertension)   Thrombocytopenia (HCC)   Heart failure (HCC)   Acute respiratory failure with hypoxia (HCC)   Acute on chronic congestive heart failure (HCC)   Acute pulmonary edema (HCC)   Acute respiratory failure with hypoxemia (HCC)   Hypokalemia   AKI (acute kidney injury) (HCC)   Muscular deconditioning   Chronic atrial fibrillation (HCC)    SUBJECTIVE  The patient is now on telemetry.  She feels better.  She is on nasal oxygen at 4 L a minute.  She is in no respiratory distress.  She is swallowing without difficulty.  CURRENT MEDS . antiseptic oral rinse  7 mL Mouth Rinse BID  . furosemide  60 mg Oral BID  . guaiFENesin  1,200 mg Oral BID  . insulin aspart  0-15 Units Subcutaneous 6 times per day  . insulin glargine  10 Units Subcutaneous QHS  . isosorbide-hydrALAZINE  1 tablet Oral TID  . levofloxacin (LEVAQUIN) IV  750 mg Intravenous Q48H  . levothyroxine  50 mcg Intravenous Daily  . methylPREDNISolone (SOLU-MEDROL) injection  40 mg Intravenous Q12H  . metoprolol  5 mg Intravenous 4 times per day  . nitroGLYCERIN  0.5 inch Topical 4 times per day  . simvastatin  20 mg Oral q1800  . sodium chloride  3 mL Intravenous Q12H  . Warfarin - Pharmacist Dosing Inpatient   Does not apply q1800    OBJECTIVE  Filed Vitals:   09/20/15 0028 09/20/15 0200 09/20/15 0238 09/20/15 0646  BP: 117/55 114/48 118/59 123/65  Pulse: 85 100 97 87  Temp: 98.4 F (36.9 C)   98 F (36.7 C)  TempSrc: Oral   Oral  Resp: 19   18  Height:      Weight:    190 lb 1.6 oz (86.229 kg)  SpO2: 94%   95%    Intake/Output Summary (Last 24 hours) at 09/20/15 0957 Last data filed at 09/20/15 0907  Gross per 24 hour  Intake  749.83 ml  Output    775 ml  Net -25.17 ml   Filed Weights   09/19/15 0500 09/19/15 1446 09/20/15 0646  Weight: 187 lb 2.7 oz (84.9 kg) 185 lb 12.8 oz (84.278 kg) 190 lb 1.6 oz (86.229 kg)    PHYSICAL EXAM  General: Pleasant, NAD.  Nasal oxygen in place Neuro: Alert and oriented X 3. Moves all extremities spontaneously. Psych: Normal affect. HEENT:  Normal  Neck: Supple without bruits or JVD. Lungs:  Resp regular and unlabored, rhonchi and rales at left base.  Right chest relatively clear.  Flail chest noted. Heart: Irregularly irregular no s3, s4, or murmurs. Abdomen: Soft, non-tender, non-distended, BS + x 4.  Extremities: No clubbing, cyanosis or edema. DP/PT/Radials 2+ and equal bilaterally.  Accessory Clinical Findings  CBC  Recent Labs  09/18/15 0546  WBC 13.2*  HGB 13.0  HCT 39.3  MCV 88.7  PLT 390   Basic Metabolic Panel  Recent Labs  09/19/15 0222 09/20/15 0314  NA 145 143  K 4.8 4.6  CL 101 95*  CO2 28 36*  GLUCOSE 254* 256*  BUN 34* 56*  CREATININE 1.19* 1.50*  CALCIUM 9.1 9.3   Liver Function Tests  No results for input(s): AST, ALT, ALKPHOS, BILITOT, PROT, ALBUMIN in the last 72 hours. No results for input(s): LIPASE, AMYLASE in the last 72 hours. Cardiac Enzymes No results for input(s): CKTOTAL, CKMB, CKMBINDEX, TROPONINI in the last 72 hours. BNP Invalid input(s): POCBNP D-Dimer No results for input(s): DDIMER in the last 72 hours. Hemoglobin A1C No results for input(s): HGBA1C in the last 72 hours. Fasting Lipid Panel No results for input(s): CHOL, HDL, LDLCALC, TRIG, CHOLHDL, LDLDIRECT in the last 72 hours. Thyroid Function Tests No results for input(s): TSH, T4TOTAL, T3FREE, THYROIDAB in the last 72 hours.  Invalid input(s): FREET3  TELE  Atrial fibrillation with controlled ventricular response  ECG    Radiology/Studies  Ct Angio Chest Pe W/cm &/or Wo Cm  09/14/2015  CLINICAL DATA:  Hypoxia. History of congestive heart  failure and atrial fibrillation EXAM: CT ANGIOGRAPHY CHEST WITH CONTRAST TECHNIQUE: Multidetector CT imaging of the chest was performed using the standard protocol during bolus administration of intravenous contrast. Multiplanar CT image reconstructions and MIPs were obtained to evaluate the vascular anatomy. CONTRAST:  85mL OMNIPAQUE IOHEXOL 350 MG/ML SOLN COMPARISON:  Chest x-ray 09/13/2013 FINDINGS: Negative for pulmonary embolism. Mild pulmonary artery enlargement likely due to COPD. Prior CABG. Coronary calcification. Mild cardiac enlargement. Ascending aorta mildly dilated at 43 mm. Descending thoracic aorta atherosclerotic without aneurysm. No pericardial effusion Left lower lobe mass lesion measures 24 x 30 mm. There is central fluid density with surrounding enhancing soft tissue density. Two areas of sub solid density right upper lobe anteriorly measuring approximately 1 cm each. Right lower lobe 8 mm subpleural nodule axial image 131 series 506. Negative for pleural effusion. Precarinal lymph node measures 17 x 22 mm. No mediastinal mass. No hilar adenopathy. Upper abdomen reveals no acute abnormality. No acute skeletal abnormality. Review of the MIP images confirms the above findings. IMPRESSION: Negative for pulmonary embolism. Left lower lobe mass lesion with central fluid density. This may represent carcinoma of the lung. Lung abscess, pneumonia, and other benign etiologies also possible. Three lung nodules on the right as above. Differential includes malignancy and infection and scarring. Close follow-up recommended. Electronically Signed   By: Marlan Palau M.D.   On: 09/14/2015 10:51   Dg Chest Port 1 View  09/18/2015  CLINICAL DATA:  Dyspnea. EXAM: PORTABLE CHEST 1 VIEW COMPARISON:  Most recent radiographs yesterday at 0452 hour. Chest CT 09/14/2015 FINDINGS: Patient has been extubated. Significant rotation to the right. Volume loss at the left lung base with dense basilar opacity, unchanged.  Probable adjacent pleural effusion. Cardiomegaly is grossly stable. Interstitial edema has progressed. A skin fold projects over the right hemithorax. No pneumothorax. IMPRESSION: 1. Persistent volume loss in the left hemithorax with dense left basilar opacity, atelectasis/collapse versus pneumonia. Probable left pleural effusion. 2. Grossly stable cardiomegaly.  Mild increased interstitial edema. Electronically Signed   By: Rubye Oaks M.D.   On: 09/18/2015 01:27   Dg Chest Port 1 View  09/17/2015  CLINICAL DATA:  Acute respiratory failure with hypoxia EXAM: PORTABLE CHEST 1 VIEW COMPARISON:  Chest x-rays dated 09/16/2015 and 09/15/2015. FINDINGS: Endotracheal tube well positioned with tip just above the level of the carina. Enteric tube passes below the diaphragm. Cardiomegaly is unchanged. Dense opacity at the left lung base persists, most suggestive of pneumonia or airspace collapse. Suspect small adjacent pleural effusion. Mild bile interstitial edema is unchanged. No new lung findings. No pneumothorax. IMPRESSION: 1.  Overall, no interval change. 2. Stable dense opacity at the left  lung base, most likely airspace collapse or pneumonia. Suspect small adjacent pleural effusion. 3. Cardiomegaly with stable mild bilateral interstitial edema suggesting mild volume overload/CHF. Electronically Signed   By: Bary RichardStan  Maynard M.D.   On: 09/17/2015 08:53   Portable Chest Xray  09/16/2015  CLINICAL DATA:  Shortness of breath EXAM: PORTABLE CHEST 1 VIEW COMPARISON:  Chest radiograph from one day prior. FINDINGS: Left rotated chest radiograph. Endotracheal tube tip is 2.5 cm above the carina. Enteric tube enters the stomach with the tip not seen on this image. Stable cardiomediastinal silhouette with mild cardiomegaly. No pneumothorax. No right pleural effusion. Stable blunting of the left costophrenic angle, likely a small left pleural effusion. Stable volume loss and consolidation at the left lung base. Mild linear  parahilar opacities appear stable and likely represent mild pulmonary edema. IMPRESSION: 1. Endotracheal and enteric tubes appear well-positioned. 2. Stable volume loss and consolidation at the left lung base, which could represent a combination of atelectasis, pneumonia and/or lung mass. 3. Likely small left pleural effusion. 4. Stable mild cardiomegaly with mild pulmonary edema. Electronically Signed   By: Delbert PhenixJason A Poff M.D.   On: 09/16/2015 07:38   Portable Chest Xray  09/15/2015  CLINICAL DATA:  Ventilator dependent respiratory failure. EXAM: PORTABLE CHEST 1 VIEW COMPARISON:  Earlier same day FINDINGS: Endotracheal tube has its tip 3 cm above the carina. Nasogastric tube enters the abdomen. Left lower lobe pneumonia/ collapse persists, with collapse being more complete. Right lung well aerated. IMPRESSION: Endotracheal tube and nasogastric tube well positioned. Left lower lobe pneumonia/collapse persists, with collapse being more complete. Electronically Signed   By: Paulina FusiMark  Shogry M.D.   On: 09/15/2015 08:11   Dg Chest Port 1 View  09/15/2015  CLINICAL DATA:  Acute onset of shortness of breath. Initial encounter. EXAM: PORTABLE CHEST 1 VIEW COMPARISON:  Chest radiograph and CTA of the chest from 09/13/2014 FINDINGS: The lungs are well-aerated. Left basilar airspace opacity may reflect pneumonia or asymmetric pulmonary edema. Underlying vascular congestion is noted. A small left pleural effusion is noted. No pneumothorax is seen. The right costophrenic angle is incompletely imaged on this study. The cardiomediastinal silhouette is is mildly enlarged. The patient is status post CABG. No acute osseous abnormalities are seen. IMPRESSION: Left basilar airspace opacity may reflect pneumonia or asymmetric pulmonary edema. Vascular congestion and mild cardiomegaly. Small left pleural effusion noted. Electronically Signed   By: Roanna RaiderJeffery  Chang M.D.   On: 09/15/2015 04:44   Dg Chest Port 1 View  09/14/2015  CLINICAL  DATA:  Acute onset of respiratory distress and shortness of breath. Initial encounter. EXAM: PORTABLE CHEST 1 VIEW COMPARISON:  None. FINDINGS: The lungs are well-aerated. Vascular congestion is noted. Mildly increased interstitial markings may reflect mild interstitial edema. No definite pleural effusion or pneumothorax is seen. The cardiomediastinal silhouette is mildly enlarged. The patient is status post CABG. No acute osseous abnormalities are seen. IMPRESSION: Vascular congestion and mild cardiomegaly. Mildly increased interstitial markings may reflect mild interstitial edema. Electronically Signed   By: Roanna RaiderJeffery  Chang M.D.   On: 09/14/2015 01:59    ASSESSMENT AND PLAN 1.  Chronic Atrial Fibrillation - This patients CHA2DS2-VASc Score and unadjusted Ischemic Stroke Rate (% per year) is equal to 11.2 % stroke rate/year from a score of 7 (CHF, HTN, DM, Vascular, Female, Age > 75 (2)). Continue Coumadin for anticoagulation.  Will switch her metoprolol from IV to oral. 2.  Left lower lobe mass Will need further evaluation as an outpatient 3.  Coronary artery disease with history of CABG in 2012 at [facility].  She has a flail chest. Mild elevation of troponins with peak 0.07 and a plateau pattern.  Consider nuclear stress test as an outpatient 4.  Acute on chronic combined systolic and diastolic congestive heart failure.  Appears euvolemic 5.  Acute on chronic renal failure.  Serum creatinine has risen to 1.50.  Management per primary team.  May need to cut back on Lasix.  She appears to be euvolemic.   Signed, Ronny Flurry MD

## 2015-09-20 NOTE — Progress Notes (Signed)
[facility] PROGRESS NOTE  Lori Harrington ZOX:[id number] DOB: 1929-11-14 DOA: 09/14/2015 PCP: No primary care provider on file.  Assessment/Plan: #1 acute hypoxic respiratory failure secondary to acute systolic CHF exacerbation and acute bronchitis Patient was initially intubated and subsequently was on BiPAP which has improved. Patient currently off BiPAP without clinical improvement. 2-D echo which was obtained with EF of 40-45% with mild aortic valvular stenosis. Patient diuresed well and is -11.937 L during his hospitalization. Patient with worsening/increasing creatinine and a such will transition to oral Lasix. Continue Mucinex, BiDil, Levaquin, steroid taper, Toprol-XL, Zocor. Continue nebs. Follow.  #2 atrial fibrillation CHA2DS2VASC score 7.Currently on IV metoprolol for rate control. Coumadin  on hold due to supratherapeutic INR which is currently at 4.24. Coumadin per pharmacy. Cardiology following.  #3 ACUTE ON CHRONIC RENAL FAILURE PATIENT WITH WORSENING RENAL FUNCTION LIKELY SECONDARY TO DIURESES. CHANGE IV LASIX TO ORAL LASIX. FOLLOW.  #4 LUNG MASS PER PULMONARY REPEAT CT CHEST AS OUTPATIENT.  #5 DYSPHAGIA CONTINUE CURRENT DYSPHAGIA DIET.  #6 HYPOKALEMIA REPLETED.  #7 THROMBOCYTOPENIA CHRONIC. NO BLEEDING. FOLLOW.  #8 HYPOTHYROIDISM CONTINUE SYNTHROID.  #9 DIABETES MELLITUS HEMOGLOBIN A1C 7.9. CBGS RANGING FROM 214-263. Increase Lantus to 20 units daily. Continue sliding scale insulin.  #10 coronary artery disease status post [facility] Patient with mildly elevated troponins that peaked at 0.079 currently plateaued. Cardiology following the recommended nuclear stress test as outpatient.  #11 chronic pain Continue current pain regimen. Follow.  #12 history of gout Resume allopurinol.  #13 prophylaxis INR currently supratherapeutic at 4.24 for DVT prophylaxis. Coumadin on hold.   Code Status: DO NOT RESUSCITATE Family Communication: Updated  patient and grandson at bedside. Disposition Plan: Remain on telemetry.   Consultants:  PCCM admit  Cardiology: Dr. Eldridge DaceVaranasi, 1/06/ 2017  Procedures:  CT angiogram chest 09/14/2015  Chest x-ray 09/18/2015, 09/17/2015, 09/16/2015, 09/15/2015, 09/14/2015  2-D echo 09/14/2015  Antibiotics:  IV Levaquin 09/15/2015   HPI/Subjective: Patient states some improvement with shortness of breath. Patient states feeling better. No chest pain.  Objective: Filed Vitals:   09/20/15 0238 09/20/15 0646  BP: 118/59 123/65  Pulse: 97 87  Temp:  98 F (36.7 C)  Resp:  18    Intake/Output Summary (Last 24 hours) at 09/20/15 1117 Last data filed at 09/20/15 1012  Gross per 24 hour  Intake 629.83 ml  Output    975 ml  Net -345.17 ml   Filed Weights   09/19/15 0500 09/19/15 1446 09/20/15 0646  Weight: 84.9 kg (187 lb 2.7 oz) 84.278 kg (185 lb 12.8 oz) 86.229 kg (190 lb 1.6 oz)    Exam:   General:  NAD  Cardiovascular: Irregularly irregular  Respiratory: Some rales in left base. No wheezing. Flial chest noted.  Abdomen: Soft/NT/ND/+BS  Musculoskeletal: No c/c/e   Data Reviewed: Basic Metabolic Panel:  Recent Labs Lab 09/15/15 0730 09/15/15 1045 09/15/15 1258 09/16/15 0218 09/17/15 0237 09/18/15 0734 09/19/15 0222 09/20/15 0314  NA 134* 134*  --  138 140 146* 145 143  K 4.5 4.1  --  3.4* 3.4* 3.9 4.8 4.6  CL 93* 92*  --  95* 96* 100* 101 95*  CO2 32 31  --  32 35* 36* 28 36*  GLUCOSE 183* 126*  --  163* 266* 196* 254* 256*  BUN 37* 40*  --  43* 36* 30* 34* 56*  CREATININE 1.25* 1.25*  --  1.45* 1.13* 1.15* 1.19* 1.50*  CALCIUM 9.0 8.8*  --  8.7* 8.6* 8.9  9.1 9.3  MG 2.0 2.0  --   --   --   --   --   --   PHOS 4.3  --  2.0*  --   --   --   --   --    Liver Function Tests:  Recent Labs Lab 09/16/15 0218 09/17/15 0237  AST 34 30  ALT 22 20  ALKPHOS 60 59  BILITOT 0.8 1.0  PROT 5.9* 6.1*  ALBUMIN 3.0* 2.9*   No results for input(s): LIPASE, AMYLASE in  the last 168 hours. No results for input(s): AMMONIA in the last 168 hours. CBC:  Recent Labs Lab 09/14/15 0108 09/15/15 0730 09/17/15 0237 09/18/15 0546  WBC 9.3 10.7* 11.5* 13.2*  NEUTROABS 4.4  --  7.3  --   HGB 13.6 11.9* 12.4 13.0  HCT 42.6 38.2 37.5 39.3  MCV 90.1 89.9 87.0 88.7  PLT 149* 157 168 390   Cardiac Enzymes:  Recent Labs Lab 09/14/15 0108 09/15/15 0730 09/15/15 1045 09/15/15 1640 09/15/15 1902  TROPONINI 0.03 0.05* 0.06* 0.06* 0.07*   BNP (last 3 results)  Recent Labs  09/14/15 0108 09/15/15 0730  BNP 746.6* 381.9*    ProBNP (last 3 results) No results for input(s): PROBNP in the last 8760 hours.  CBG:  Recent Labs Lab 09/19/15 2017 09/19/15 2224 09/20/15 0218 09/20/15 0640 09/20/15 0820  GLUCAP 303* 277* 244* 263* 214*    Recent Results (from the past 240 hour(s))  MRSA PCR Screening     Status: None   Collection Time: 09/14/15  3:09 PM  Result Value Ref Range Status   MRSA by PCR NEGATIVE NEGATIVE Final    Comment:        The GeneXpert MRSA Assay (FDA approved for NASAL specimens only), is one component of a comprehensive MRSA colonization surveillance program. It is not intended to diagnose MRSA infection nor to guide or monitor treatment for MRSA infections.   MRSA PCR Screening     Status: None   Collection Time: 09/15/15  4:24 AM  Result Value Ref Range Status   MRSA by PCR NEGATIVE NEGATIVE Final    Comment:        The GeneXpert MRSA Assay (FDA approved for NASAL specimens only), is one component of a comprehensive MRSA colonization surveillance program. It is not intended to diagnose MRSA infection nor to guide or monitor treatment for MRSA infections.      Studies: No results found.  Scheduled Meds: . antiseptic oral rinse  7 mL Mouth Rinse BID  . furosemide  60 mg Oral BID  . guaiFENesin  1,200 mg Oral BID  . insulin aspart  0-15 Units Subcutaneous 6 times per day  . insulin glargine  10 Units  Subcutaneous QHS  . isosorbide-hydrALAZINE  1 tablet Oral TID  . levofloxacin (LEVAQUIN) IV  750 mg Intravenous Q48H  . levothyroxine  62.5 mcg Intravenous Daily  . methylPREDNISolone (SOLU-MEDROL) injection  40 mg Intravenous Q12H  . metoprolol succinate  25 mg Oral BID  . nitroGLYCERIN  0.5 inch Topical 4 times per day  . simvastatin  20 mg Oral q1800  . sodium chloride  3 mL Intravenous Q12H  . Warfarin - Pharmacist Dosing Inpatient   Does not apply q1800   Continuous Infusions: . sodium chloride 10 mL/hr at 09/20/15 8295    Principal Problem:   Acute respiratory failure with hypoxia (HCC) Active Problems:   Acute on chronic combined systolic and diastolic CHF (congestive heart failure) (  HCC)   Acute exacerbation of CHF (congestive heart failure) (HCC)   Diabetes mellitus, type 2 (HCC)   Acute respiratory failure (HCC)   Gout   Hypothyroid   HTN (hypertension)   Thrombocytopenia (HCC)   Heart failure (HCC)   Acute on chronic congestive heart failure (HCC)   Acute pulmonary edema (HCC)   Hypokalemia   AKI (acute kidney injury) (HCC)   Muscular deconditioning   Chronic atrial fibrillation (HCC)   Renal failure (ARF), acute on chronic (HCC)   Acute bronchitis    Time spent: 35 mins    [facility] [place] MD [facility] Pager [phone]. If 7PM-7AM, please contact night-coverage at [email or web address], password [facility] 09/20/2015, 11:17 AM  LOS: 6 days

## 2015-09-20 NOTE — Progress Notes (Signed)
Heart Failure Navigator Consult Note  Presentation: Lori Harrington is a 82 yo F with a PMHx of CHF, Afib, HLD who presented to the hospital yesterday for acute onset SOB; no CP. She was hypoxic in the 80s when EMS arrived and was placed on cpap. She was prescribed Levaquin for cough and sinusitis by her PCP the day prior to admission. Patient was thought to have acute hypoxic respiratory failure secondary to heart failure (EF 45% in 03/2014). CXR showing mild vascular congestion and BNP in 700s. CTA was negative for PR but showed a LLL mass lesion. She was placed on Bipap and diuresed with IV Lasix.    Past Medical History  Diagnosis Date  . CHF (congestive heart failure) (HCC)   . Anemia   . Mitral valve disorder   . Atrial fibrillation (HCC)   . Pure hypercholesterolemia   . Proteinuria     Social History   Social History  . Marital Status: Widowed    Spouse Name: N/A  . Number of Children: N/A  . Years of Education: N/A   Social History Main Topics  . Smoking status: Never Smoker   . Smokeless tobacco: None  . Alcohol Use: No  . Drug Use: No  . Sexual Activity: Not Asked   Other Topics Concern  . None   Social History Narrative  . None    ECHO:Study Conclusions-09/14/15  - Left ventricle: Inferior and septal hypokinesis. The cavity size was mildly dilated. Wall thickness was normal. Systolic function was mildly to moderately reduced. The estimated ejection fraction was in the range of 40% to 45%. - Aortic valve: There was mild stenosis. There was trivial regurgitation. Valve area (VTI): 2.51 cm^2. Valve area (Vmax): 2.55 cm^2. Valve area (Vmean): 2.46 cm^2. - Mitral valve: ? Previous MV repair with trivial residual MR. Valve area by continuity equation (using LVOT flow): 2.21 cm^2. - Left atrium: The atrium was moderately dilated. - Right ventricle: The cavity size was mildly dilated. - Right atrium: The atrium was mildly to moderately dilated. -  Atrial septum: No defect or patent foramen ovale was identified. - Pulmonary arteries: PA peak pressure: 45 mm Hg (S).  ------------------------------------------------------------------- Labs, prior tests, procedures, and surgery: Coronary artery bypass grafting.  Transthoracic echocardiography. M-mode, complete 2D, spectral Doppler, and color Doppler. Birthdate: Patient birthdate: [birth year]. Age: Patient is 82 yr old. Sex: Gender: female. BMI: 32.6 kg/m^2. Blood pressure:   154/81 Patient status: Inpatient. Study date: Study date: 09/14/2015. Study time: 04:50 PM. Location: Bedside.  BNP    Component Value Date/Time   BNP 381.9* 09/15/2015 0730    ProBNP No results found for: PROBNP   Education Assessment and Provision:  Detailed education and instructions provided on heart failure disease management including the following:  Signs and symptoms of Heart Failure When to call the physician Importance of daily weights Low sodium diet Fluid restriction Medication management Anticipated future follow-up appointments  Patient education given on each of the above topics.  Patient acknowledges understanding and acceptance of all instructions.   I spoke with patient and her daughter (who lives in [place]) regarding her HF and HF recommendations for home.  She does weigh daily however they were unable to articulate at what point she would contact the physician.  I emphasized the importance of daily weights and how weight increases relate to the signs and symptoms of HF.  She also admits to eating frozen pizzas, canned soups and [name] for meals.  Most of the time  her daughter- in- law cooks or brings her food--however she does at times resort to above foods.  I reinforced a low sodium diet and high sodium foods to avoid.  The daughter asked many questions regarding low sodium diet combined along with carbohydrate counting.  I will order a dietician consult for more  in depth diet teaching related to both.  She denies any issues with getting or taking prescribed medications-yet admits to not taking Lasix at Christmas because of the inconvenience of frequent urination.  I urged her to never skip medications.  [place] and [place]ologist in [place].  She will follow-up there.  Education Materials:  "Living Better With Heart Failure" Booklet, Daily Weight Tracker Tool   High Risk Criteria for Readmission and/or Poor Patient Outcomes:   EF <30%- No 40-45%  2 or more admissions in 6 months- No  Difficult social situation- No? Lives alone currently with help from son and daughter in law  Demonstrates medication noncompliance- Denies-however daughter says that "she could use some help at home with meds".    Barriers of Care:  Knowledge, compliance and ability to provide self care.  Discharge Planning:   She tells me that she is going to rehab after hosptalization.  She will benefit from [facility] after discharge from SNF for ongoing education, compliance, and symptom recognition.

## 2015-09-20 NOTE — Clinical Social Work Note (Signed)
Clinical Social Work Assessment  Patient Details  Name: Lori Harrington MRN: [id number] Date of Birth: 1930/04/12  Date of referral:  09/20/15               Reason for consult:  Other (Comment Required), Facility Placement                Permission sought to share information with:  Facility Sport and exercise psychologist, Family Supports Permission granted to share information::  Yes, Recruitment consultant Granted  Name::     Lori Harrington, Son- [place], [place], [place] and [place]::     Relationship::     Contact Information:     Housing/Transportation Living arrangements for the past 2 months:  [place] of Information:  Patient, Other (Comment Required) (GrandsonNori Harrington) Patient Interpreter Needed:  None Criminal Activity/Legal Involvement Pertinent to Current Situation/Hospitalization:  No - Comment as needed Significant Relationships:  Adult Children, Other Family Members Lives with:  Self Do you feel safe going back to the place where you live?  No (Not until after completion of rehab) Need for family participation in patient care:  Yes (Comment)  Care giving concerns:  Patient and grandson at bedside- verbalizes a desire for SNF placement information for rehab.  Patient states she has been a resident at [facility] in [place] 2 times in the past and would prefer to return there rather than seek placement at another facility.   Social Worker assessment / plan:  CSW met with patient and her grandson Lori Harrington in patient's room with grandson calling patient's son Lori Harrington for further information and instruction.  Both are agreeable to SNF placement for short term rehab with placement preference listed above.  Discussed be search process and Fl2 will be completed and placed on chart for MD's signature. Family states if unable to obtain placement at [facility]- then search SNF's in [place] and [place] counties.  CSW spoke with  Lori Harrington- Admissions at [facility]. She will review patient's medical information and call tomorrow re: possible bed offer. Active bed search in place.  Employment status:  Retired Forensic scientist:  Medicare PT Recommendations:  [place] / Referral to community resources:  [place]  Patient/Family's Response to care:  Patient and grandson are agreeable with current plan for d/c and are realistic about patient's current inability to return home alone at this time. Patient wants to get better and feels that with therapy she will improve.  Patient/Family's Understanding of and Emotional Response to Diagnosis, Current Treatment, and Prognosis:   While patient noted to be alert and oriented- she consistently deferred to her grandson and son (on phone with grandson) for decision making issues.  Patient is able to verbalize understanding of current medical condition and is anxious to return to prior level of independence.  Family is fully supportive of SNF rehab choice and state that this is best option for patient.   Emotional Assessment Appearance:  Appears stated age Attitude/Demeanor/Rapport:   (Appropriate for age and situation) Affect (typically observed):  Pleasant, Quiet, Stable Orientation:  Oriented to Self, Oriented to Place, Oriented to  Time, Oriented to Situation Alcohol / Substance use:  Never Used Psych involvement (Current and /or in the community):  No (Comment)  Discharge Needs  Concerns to be addressed:  Care Coordination Readmission within the last 30 days:  No Current discharge risk:  Dependent with Mobility Barriers to Discharge:  Continued Medical Work up  Lori Area, LCSW 09/20/2015, 9:17 PM

## 2015-09-20 NOTE — Clinical Social Work Placement (Signed)
   CLINICAL SOCIAL WORK PLACEMENT  NOTE  Date:  09/20/2015  Patient Details  Name: Marilynn RailJoyce A Laroche MRN: [id number] Date of Birth: 04/27/1930  Clinical Social Work is seeking post-discharge placement for this patient at the Skilled  Nursing Facility level of care (*CSW will initial, date and re-position this form in  chart as items are completed):  Yes   Patient/family provided with [facility] Clinical Social Work Department's list of facilities offering this level of care within the geographic area requested by the patient (or if unable, by the patient's family).  Yes   Patient/family informed of their freedom to choose among providers that offer the needed level of care, that participate in Medicare, Medicaid or managed care program needed by the patient, have an available bed and are willing to accept the patient.  Yes   Patient/family informed of [place]'s ownership interest in [facility] and [facility], as well as of the fact that they are under no obligation to receive care at these facilities.  PASRR submitted to EDS on 09/20/15     PASRR number received on 09/20/15     Existing PASRR number confirmed on       FL2 transmitted to all facilities in geographic area requested by pt/family on 09/20/15     FL2 transmitted to all facilities within larger geographic area on       Patient informed that his/her managed care company has contracts with or will negotiate with certain facilities, including the following:            Patient/family informed of bed offers received.  Patient chooses bed at       Physician recommends and patient chooses bed at      Patient to be transferred to   on  .  Patient to be transferred to facility by Ambulance Sharin Mons(PTAR)     Patient family notified on   of transfer.  Name of family member notified:        PHYSICIAN Please prepare priority discharge summary, including medications, Please sign FL2, Please sign DNR, Please prepare  prescriptions     Additional Comment:    _______________________________________________ Darylene Pricerowder, [place] Dirosa T, LCSW 09/20/2015, 9:28 PM

## 2015-09-20 NOTE — Progress Notes (Signed)
Patient and family refusing to removed the foley catheter at this time even Lasix was changed to PO instead of IV. Family stated that the patient is frequently urinating and it's still too weak to be in the bedpan at all times. MD notified.

## 2015-09-20 NOTE — Progress Notes (Signed)
ANTICOAGULATION CONSULT NOTE - Follow Up Consult  Pharmacy Consult for warfarin Indication: atrial fibrillation  Allergies  Allergen Reactions  . Adhesive [Tape]     Skin turns red and itching   . Codeine     Stop breathing   . Oxycodone     unknown    Patient Measurements: Height: 5' 9.5" (176.5 cm) Weight: 190 lb 1.6 oz (86.229 kg) (bed; pt states can't stand) IBW/kg (Calculated) : 67.35  Vital Signs: Temp: 98 F (36.7 C) (01/11 0646) Temp Source: Oral (01/11 0646) BP: 123/65 mmHg (01/11 0646) Pulse Rate: 87 (01/11 0646)  Labs:  Recent Labs  09/18/15 0546 09/18/15 0734 09/19/15 0222 09/19/15 0445 09/20/15 0314  HGB 13.0  --   --   --   --   HCT 39.3  --   --   --   --   PLT 390  --   --   --   --   LABPROT 21.4*  --  >90.0* 28.5* 39.7*  INR 1.87*  --  >10.00* 2.73* 4.24*  CREATININE  --  1.15* 1.19*  --  1.50*    Estimated Creatinine Clearance: 33.6 mL/min (by C-G formula based on Cr of 1.5).  Assessment: 82 yo F admitted 09/14/2015 with SOB and respiratory distress on warfarin PTA for Afib. INR is now supratherapeutic again at 4.24. No new CBC today but no bleeding noted. Pt continues on levaquin which may increase the INR.  PTA warfarin: 4 mg daily  Goal of Therapy:  INR 2-3 Monitor platelets by anticoagulation protocol: Yes   Plan:  - No coumadin today - Daily INR  Lysle Pearlachel Mistie Adney, PharmD, BCPS Pager # [phone] 09/20/2015 8:56 AM

## 2015-09-20 NOTE — [place] FL2 (Deleted)
[place] MEDICAID FL2 LEVEL OF CARE SCREENING TOOL     IDENTIFICATION  Patient Name: Lori Harrington Birthdate: 1930/03/11 Sex: female Admission Date (Current Location): 09/14/2015  [facility] and [place] Number:  [place], television[place]film/video and Address:  [facility] [place], [address], [place], [place] [address]      Provider Number: [id number]  Attending Physician Name and Address:  Rodolph Bong, MD  Relative Name and Phone Number:       Current Level of Care: Hospital Recommended Level of Care: Skilled Nursing Facility Prior Approval Number:    Date Approved/Denied:   PASRR Number:    Discharge Plan: SNF    Current Diagnoses: Patient Active Problem List   Diagnosis Date Noted  . Renal failure (ARF), acute on chronic (HCC) 09/20/2015  . Acute on chronic combined systolic and diastolic CHF (congestive heart failure) (HCC) 09/20/2015  . Acute bronchitis 09/20/2015  . Muscular deconditioning   . Chronic atrial fibrillation (HCC)   . Acute respiratory failure with hypoxemia (HCC)   . Hypokalemia   . AKI (acute kidney injury) (HCC)   . Acute respiratory failure with hypoxia (HCC)   . Acute on chronic congestive heart failure (HCC)   . Acute pulmonary edema (HCC)   . Acute exacerbation of CHF (congestive heart failure) (HCC) 09/14/2015  . Diabetes mellitus, type 2 (HCC) 09/14/2015  . Acute respiratory failure (HCC) 09/14/2015  . Gout 09/14/2015  . Atrial fibrillation (HCC) 09/14/2015  . Hypothyroid 09/14/2015  . HTN (hypertension) 09/14/2015  . Thrombocytopenia (HCC) 09/14/2015  . Heart failure (HCC) 09/14/2015  . Essential hypertension     Orientation RESPIRATION BLADDER Height & Weight    Self, Time, Situation, Place  O2 (3L/min) Continent 5' 9.5" (176.5 cm) 190 lbs.  BEHAVIORAL SYMPTOMS/MOOD NEUROLOGICAL BOWEL NUTRITION STATUS      Continent Diet (Dysphhagia 3  Nectar thick fluids)  AMBULATORY STATUS COMMUNICATION OF NEEDS Skin   Limited  Assist Verbally Other (Comment) (Cellulitis R and L legs)                       Personal Care Assistance Level of Assistance  Bathing, Dressing Bathing Assistance: Limited assistance   Dressing Assistance: Limited assistance     Functional Limitations Info             SPECIAL CARE FACTORS FREQUENCY  PT (By licensed PT), OT (By licensed OT)     PT Frequency: 5 OT Frequency: 5            Contractures Contractures Info: Not present    Additional Factors Info  Code Status, Allergies, Insulin Sliding Scale Code Status Info: DNR Allergies Info: Adhesive, Codeine, Oxycodone   Insulin Sliding Scale Info: See Med list and Sliding scale       Current Medications (09/20/2015):  This is the current hospital active medication list Current Facility-Administered Medications  Medication Dose Route Frequency Provider Last Rate Last Dose  . 0.9 %  sodium chloride infusion   Intravenous Continuous Lupita Leash, MD 10 mL/hr at 09/20/15 0904    . acetaminophen (TYLENOL) tablet 650 mg  650 mg Oral Q6H PRN Courtney Paris, MD   650 mg at 09/20/15 2033  . albuterol (PROVENTIL) (2.5 MG/3ML) 0.083% nebulizer solution 2.5 mg  2.5 mg Nebulization Q3H PRN Lupita Leash, MD   2.5 mg at 09/16/15 1638  . antiseptic oral rinse (CPC / CETYLPYRIDINIUM CHLORIDE 0.05%) solution 7 mL  7  mL Mouth Rinse BID Lupita Leashouglas B McQuaid, MD   7 mL at 09/20/15 1152  . atropine 0.1 MG/ML injection 1 mg  1 mg Intravenous PRN Courtney ParisEden W Jones, MD      . feeding supplement (GLUCERNA SHAKE) (GLUCERNA SHAKE) liquid 237 mL  237 mL Oral TID BM Reanne J Barbato, RD   237 mL at 09/20/15 2033  . furosemide (LASIX) tablet 60 mg  60 mg Oral BID Rodolph Bonganiel Thompson V, MD   60 mg at 09/20/15 1729  . guaiFENesin (MUCINEX) 12 hr tablet 1,200 mg  1,200 mg Oral BID Fuller Planhristopher W Rice, MD   1,200 mg at 09/20/15 1149  . guaiFENesin (ROBITUSSIN) 100 MG/5ML solution 100 mg  5 mL Oral Q4H PRN Rodolph Bonganiel Thompson V, MD   100 mg at 09/20/15 1732   . hydrALAZINE (APRESOLINE) injection 10 mg  10 mg Intravenous Q6H PRN Rodolph Bonganiel Thompson V, MD      . insulin aspart (novoLOG) injection 0-15 Units  0-15 Units Subcutaneous 6 times per day Rahul Astrid DraftsP Desai, PA-C   3 Units at 09/20/15 1731  . insulin glargine (LANTUS) injection 20 Units  20 Units Subcutaneous QHS Rodolph Bonganiel Thompson V, MD      . isosorbide-hydrALAZINE (BIDIL) 20-37.5 MG per tablet 1 tablet  1 tablet Oral TID Lupita Leashouglas B McQuaid, MD   1 tablet at 09/20/15 1730  . levofloxacin (LEVAQUIN) IVPB 750 mg  750 mg Intravenous Q48H Lupita Leashouglas B McQuaid, MD   750 mg at 09/19/15 0954  . levothyroxine (SYNTHROID, LEVOTHROID) injection 62.5 mcg  62.5 mcg Intravenous Daily Rodolph Bonganiel Thompson V, MD   62.5 mcg at 09/20/15 1322  . methylPREDNISolone sodium succinate (SOLU-MEDROL) 40 mg/mL injection 40 mg  40 mg Intravenous Q12H Lupita Leashouglas B McQuaid, MD   40 mg at 09/20/15 1148  . metoprolol succinate (TOPROL-XL) 24 hr tablet 25 mg  25 mg Oral BID Cassell Clementhomas Brackbill, MD   25 mg at 09/20/15 1730  . nitroGLYCERIN (NITROGLYN) 2 % ointment 0.5 inch  0.5 inch Topical 4 times per day Lupita Leashouglas B McQuaid, MD   0.5 inch at 09/20/15 0648  . ondansetron (ZOFRAN) tablet 4 mg  4 mg Oral Q6H PRN Meredith PelPaula M Guenther, NP       Or  . ondansetron [facility](ZOFRAN) injection 4 mg  4 mg Intravenous Q6H PRN Meredith PelPaula M Guenther, NP      . RESOURCE THICKENUP CLEAR   Oral PRN Lupita Leashouglas B McQuaid, MD      . simvastatin (ZOCOR) tablet 20 mg  20 mg Oral q1800 Fuller Planhristopher W Rice, MD   20 mg at 09/20/15 1729  . sodium chloride 0.9 % injection 3 mL  3 mL Intravenous Q12H Meredith PelPaula M Guenther, NP   3 mL at 09/20/15 0817  . Warfarin - Pharmacist Dosing Inpatient   Does not apply q1800 Bertram MillardMichael A Maccia, [facility] SystemsRPH   Stopped at 09/20/15 1800     Discharge Medications: Please see discharge summary for a list of discharge medications.  Relevant Imaging Results:  Relevant Lab Results:   Additional Information SSN:  [id number]  Darylene PriceCrowder, Maylen Waltermire T, LCSW

## 2015-09-20 NOTE — Progress Notes (Signed)
Speech Language Pathology Treatment: Dysphagia  Patient Details Name: Lori Harrington MRN: [id number] DOB: Mar 04, 1930 Today's Date: 09/20/2015 Time: 0900-0920 SLP Time Calculation (min) (ACUTE ONLY): 20 min  Assessment / Plan / Recommendation Clinical Impression  Pt seen for check of diet tolerance. Work of breathing has improved and with small sips and upright posture pt tolerated thin liquids without coughing in contrast to yesterdays assessment. When pt tried to take a sips laying down, she did cough. SLP provided instruction to pt for upright posture single sips, pt return demonstrated manipulating bed independently for appropriate posture. Will f/u x for tolerance of Dys 3/thin diet.    HPI        SLP Plan  Continue with current plan of care     Recommendations  Diet recommendations: Thin liquid;Dysphagia 3 (mechanical soft) Liquids provided via: Straw;Cup Medication Administration: Whole meds with puree Supervision: Patient able to self feed Compensations: Slow rate;Small sips/bites Postural Changes and/or Swallow Maneuvers: Seated upright 90 degrees              Plan: Continue with current plan of care   Lori Harrington, Lori NearingBonnie Harrington 09/20/2015, 11:34 AM

## 2015-09-20 NOTE — Care Management Important Message (Signed)
Important Message  Patient Details  Name: Lori RailJoyce A Clinkenbeard MRN: [id number] Date of Birth: August 01, 1930   Medicare Important Message Given:  Yes    Eloyse Causey P Aizik Reh 09/20/2015, 12:19 PM

## 2015-09-20 NOTE — [place] FL2 (Signed)
[place] MEDICAID FL2 LEVEL OF CARE SCREENING TOOL     IDENTIFICATION  Patient Name: Lori RailJoyce A Whitmill Birthdate: 1930-03-17 Sex: female Admission Date (Current Location): 09/14/2015  [facility] [place]  [place]   Facility and Address:  [facility] [place], [address], [place], [place] [address]      Provider Number: [id number]  Attending Physician Name and Address:  Rodolph Bonganiel Thompson V, MD  Relative Name and Phone Number:       Current Level of Care: Hospital Recommended Level of Care: Skilled Nursing Facility Prior Approval Number:    Date Approved/Denied:   PASRR Number:  [id number]  Discharge Plan: SNF    Current Diagnoses: Patient Active Problem List   Diagnosis Date Noted  . Renal failure (ARF), acute on chronic (HCC) 09/20/2015  . Acute on chronic combined systolic and diastolic CHF (congestive heart failure) (HCC) 09/20/2015  . Acute bronchitis 09/20/2015  . Muscular deconditioning   . Chronic atrial fibrillation (HCC)   . Acute respiratory failure with hypoxemia (HCC)   . Hypokalemia   . AKI (acute kidney injury) (HCC)   . Acute respiratory failure with hypoxia (HCC)   . Acute on chronic congestive heart failure (HCC)   . Acute pulmonary edema (HCC)   . Acute exacerbation of CHF (congestive heart failure) (HCC) 09/14/2015  . Diabetes mellitus, type 2 (HCC) 09/14/2015  . Acute respiratory failure (HCC) 09/14/2015  . Gout 09/14/2015  . Atrial fibrillation (HCC) 09/14/2015  . Hypothyroid 09/14/2015  . HTN (hypertension) 09/14/2015  . Thrombocytopenia (HCC) 09/14/2015  . Heart failure (HCC) 09/14/2015  . Essential hypertension     Orientation RESPIRATION BLADDER Height & Weight    Self, Time, Situation, Place  O2 (3L/min) Continent 5' 9.5" (176.5 cm) 190 lbs.  BEHAVIORAL SYMPTOMS/MOOD NEUROLOGICAL BOWEL NUTRITION STATUS      Continent Diet (Dysphhagia 3  Nectar thick fluids)  AMBULATORY STATUS COMMUNICATION OF NEEDS Skin    Limited Assist Verbally Other (Comment) (Cellulitis R and L legs)                       Personal Care Assistance Level of Assistance  Bathing, Dressing Bathing Assistance: Limited assistance   Dressing Assistance: Limited assistance     Functional Limitations Info             SPECIAL CARE FACTORS FREQUENCY  PT (By licensed PT), OT (By licensed OT)     PT Frequency: 5 OT Frequency: 5            Contractures Contractures Info: Not present    Additional Factors Info  Code Status, Allergies, Insulin Sliding Scale Code Status Info: DNR Allergies Info: Adhesive, Codeine, Oxycodone   Insulin Sliding Scale Info: See Med list and Sliding scale       Current Medications (09/20/2015):  This is the current hospital active medication list Current Facility-Administered Medications  Medication Dose Route Frequency Provider Last Rate Last Dose  . 0.9 %  sodium chloride infusion   Intravenous Continuous Lupita Leashouglas B McQuaid, MD 10 mL/hr at 09/20/15 0904    . acetaminophen (TYLENOL) tablet 650 mg  650 mg Oral Q6H PRN Courtney ParisEden W Jones, MD   650 mg at 09/20/15 2033  . albuterol (PROVENTIL) (2.5 MG/3ML) 0.083% nebulizer solution 2.5 mg  2.5 mg Nebulization Q3H PRN Lupita Leashouglas B McQuaid, MD   2.5 mg at 09/16/15 1638  . antiseptic oral rinse (CPC / CETYLPYRIDINIUM CHLORIDE 0.05%) solution 7 mL  7  mL Mouth Rinse BID Lupita Leash, MD   7 mL at 09/20/15 2200  . atropine 0.1 MG/ML injection 1 mg  1 mg Intravenous PRN Courtney Paris, MD      . feeding supplement (GLUCERNA SHAKE) (GLUCERNA SHAKE) liquid 237 mL  237 mL Oral TID BM Reanne J Barbato, RD   237 mL at 09/20/15 2033  . furosemide (LASIX) tablet 60 mg  60 mg Oral BID Rodolph Bong, MD   60 mg at 09/20/15 1729  . guaiFENesin (MUCINEX) 12 hr tablet 1,200 mg  1,200 mg Oral BID Fuller Plan, MD   1,200 mg at 09/20/15 2123  . guaiFENesin (ROBITUSSIN) 100 MG/5ML solution 100 mg  5 mL Oral Q4H PRN Rodolph Bong, MD   100 mg at  09/20/15 1732  . hydrALAZINE (APRESOLINE) injection 10 mg  10 mg Intravenous Q6H PRN Rodolph Bong, MD      . insulin aspart (novoLOG) injection 0-15 Units  0-15 Units Subcutaneous 6 times per day Rahul Astrid Drafts, PA-C   5 Units at 09/20/15 2124  . insulin glargine (LANTUS) injection 20 Units  20 Units Subcutaneous QHS Rodolph Bong, MD   20 Units at 09/20/15 2123  . isosorbide-hydrALAZINE (BIDIL) 20-37.5 MG per tablet 1 tablet  1 tablet Oral TID Lupita Leash, MD   1 tablet at 09/20/15 2123  . levofloxacin (LEVAQUIN) IVPB 750 mg  750 mg Intravenous Q48H Lupita Leash, MD   750 mg at 09/19/15 0954  . levothyroxine (SYNTHROID, LEVOTHROID) injection 62.5 mcg  62.5 mcg Intravenous Daily Rodolph Bong, MD   62.5 mcg at 09/20/15 1322  . methylPREDNISolone sodium succinate (SOLU-MEDROL) 40 mg/mL injection 40 mg  40 mg Intravenous Q12H Lupita Leash, MD   40 mg at 09/20/15 2124  . metoprolol succinate (TOPROL-XL) 24 hr tablet 25 mg  25 mg Oral BID Cassell Clement, MD   25 mg at 09/20/15 1730  . nitroGLYCERIN (NITROGLYN) 2 % ointment 0.5 inch  0.5 inch Topical 4 times per day Lupita Leash, MD   0.5 inch at 09/20/15 0648  . ondansetron (ZOFRAN) tablet 4 mg  4 mg Oral Q6H PRN Meredith Pel, NP       Or  . ondansetron [facility]) injection 4 mg  4 mg Intravenous Q6H PRN Meredith Pel, NP      . RESOURCE THICKENUP CLEAR   Oral PRN Lupita Leash, MD      . simvastatin (ZOCOR) tablet 20 mg  20 mg Oral q1800 Fuller Plan, MD   20 mg at 09/20/15 1729  . sodium chloride 0.9 % injection 3 mL  3 mL Intravenous Q12H Meredith Pel, NP   3 mL at 09/20/15 2125  . Warfarin - Pharmacist Dosing Inpatient   Does not apply q1800 Bertram Millard, [facility]   Stopped at 09/20/15 1800     Discharge Medications: Please see discharge summary for a list of discharge medications.  Relevant Imaging Results:  Relevant Lab Results:   Additional Information SSN:  [id number]  Darylene Price, LCSW

## 2015-09-21 LAB — GLUCOSE, CAPILLARY
GLUCOSE-CAPILLARY: 231 mg/dL — AB (ref 65–99)
Glucose-Capillary: 288 mg/dL — ABNORMAL HIGH (ref 65–99)
Glucose-Capillary: 253 mg/dL — ABNORMAL HIGH (ref 65–99)
Glucose-Capillary: 291 mg/dL — ABNORMAL HIGH (ref 65–99)
Glucose-Capillary: 260 mg/dL — ABNORMAL HIGH (ref 65–99)

## 2015-09-21 LAB — CBC
HCT: 38.6 % (ref 36.0–46.0)
HEMOGLOBIN: 12.4 g/dL (ref 12.0–15.0)
MCH: 28.6 pg (ref 26.0–34.0)
MCHC: 32.1 g/dL (ref 30.0–36.0)
MCV: 88.9 fL (ref 78.0–100.0)
Platelets: 225 10*3/uL (ref 150–400)
RBC: 4.34 MIL/uL (ref 3.87–5.11)
RDW: 15.3 % (ref 11.5–15.5)
WBC: 11.5 10*3/uL — ABNORMAL HIGH (ref 4.0–10.5)

## 2015-09-21 LAB — BASIC METABOLIC PANEL
Anion gap: 8 (ref 5–15)
BUN: 71 mg/dL — AB (ref 6–20)
CHLORIDE: 94 mmol/L — AB (ref 101–111)
CO2: 38 mmol/L — ABNORMAL HIGH (ref 22–32)
CREATININE: 1.48 mg/dL — AB (ref 0.44–1.00)
Calcium: 9.2 mg/dL (ref 8.9–10.3)
GFR calc Af Amer: 37 mL/min — ABNORMAL LOW (ref >60)
GFR calc non Af Amer: 32 mL/min — ABNORMAL LOW (ref >60)
GLUCOSE: 361 mg/dL — AB (ref 65–99)
POTASSIUM: 4.4 mmol/L (ref 3.5–5.1)
Sodium: 140 mmol/L (ref 135–145)

## 2015-09-21 LAB — PROTIME-INR
INR: 2.89 — ABNORMAL HIGH (ref 0.00–1.49)
PROTHROMBIN TIME: 29.7 s — AB (ref 11.6–15.2)

## 2015-09-21 MED ORDER — SENNOSIDES-DOCUSATE SODIUM 8.6-50 MG PO TABS
1.0000 | ORAL_TABLET | Freq: Every day | ORAL | Status: DC
  Administered 2015-09-21: 1 via ORAL
  Filled 2015-09-21: qty 1

## 2015-09-21 MED ORDER — SORBITOL 70 % SOLN
30.0000 mL | Freq: Once | Status: AC
  Administered 2015-09-21: 30 mL via ORAL
  Filled 2015-09-21: qty 30

## 2015-09-21 MED ORDER — WARFARIN SODIUM 2 MG PO TABS
2.0000 mg | ORAL_TABLET | Freq: Once | ORAL | Status: AC
  Administered 2015-09-21: 2 mg via ORAL
  Filled 2015-09-21: qty 1

## 2015-09-21 MED ORDER — PREDNISONE 50 MG PO TABS
60.0000 mg | ORAL_TABLET | Freq: Every day | ORAL | Status: DC
  Administered 2015-09-21 – 2015-09-22 (×2): 60 mg via ORAL
  Filled 2015-09-21 (×3): qty 1

## 2015-09-21 MED ORDER — LEVOTHYROXINE SODIUM 25 MCG PO TABS
125.0000 ug | ORAL_TABLET | Freq: Every day | ORAL | Status: DC
  Administered 2015-09-21 – 2015-09-22 (×2): 125 ug via ORAL
  Filled 2015-09-21 (×2): qty 1

## 2015-09-21 MED ORDER — FUROSEMIDE 40 MG PO TABS
40.0000 mg | ORAL_TABLET | Freq: Two times a day (BID) | ORAL | Status: DC
  Administered 2015-09-21 – 2015-09-22 (×2): 40 mg via ORAL
  Filled 2015-09-21 (×2): qty 1

## 2015-09-21 MED ORDER — PREDNISONE 50 MG PO TABS: 60.0000 mg | ORAL_TABLET | Freq: Every day | ORAL | Status: DC

## 2015-09-21 MED ORDER — INSULIN ASPART 100 UNIT/ML ~~LOC~~ SOLN
0.0000 [IU] | Freq: Three times a day (TID) | SUBCUTANEOUS | Status: DC
  Administered 2015-09-22: 5 [IU] via SUBCUTANEOUS
  Administered 2015-09-22: 8 [IU] via SUBCUTANEOUS

## 2015-09-21 MED ORDER — INSULIN GLARGINE 100 UNIT/ML ~~LOC~~ SOLN
25.0000 [IU] | Freq: Every day | SUBCUTANEOUS | Status: DC
  Administered 2015-09-21: 25 [IU] via SUBCUTANEOUS
  Filled 2015-09-21: qty 0.25

## 2015-09-21 NOTE — Progress Notes (Signed)
Patient Name: Lori Harrington Date of Encounter: 09/21/2015     Principal Problem:   Acute respiratory failure with hypoxia (HCC) Active Problems:   Acute exacerbation of CHF (congestive heart failure) (HCC)   Diabetes mellitus, type 2 (HCC)   Acute respiratory failure (HCC)   Gout   Hypothyroid   HTN (hypertension)   Thrombocytopenia (HCC)   Heart failure (HCC)   Acute on chronic congestive heart failure (HCC)   Acute pulmonary edema (HCC)   Hypokalemia   AKI (acute kidney injury) (HCC)   Muscular deconditioning   Chronic atrial fibrillation (HCC)   Renal failure (ARF), acute on chronic (HCC)   Acute on chronic combined systolic and diastolic CHF (congestive heart failure) (HCC)   Acute bronchitis    SUBJECTIVE  The patient feels better. Cough is improved. Telemetry shows atrial fibrillation with pulse 100 bpm. No chest pain.BP stable on current metoprolol.  CURRENT MEDS . antiseptic oral rinse  7 mL Mouth Rinse BID  . feeding supplement (GLUCERNA SHAKE)  237 mL Oral TID BM  . furosemide  60 mg Oral BID  . guaiFENesin  1,200 mg Oral BID  . insulin aspart  0-15 Units Subcutaneous 6 times per day  . insulin glargine  20 Units Subcutaneous QHS  . isosorbide-hydrALAZINE  1 tablet Oral TID  . levofloxacin (LEVAQUIN) IV  750 mg Intravenous Q48H  . levothyroxine  125 mcg Oral QAC breakfast  . metoprolol succinate  25 mg Oral BID  . nitroGLYCERIN  0.5 inch Topical 4 times per day  . predniSONE  60 mg Oral QAC breakfast  . simvastatin  20 mg Oral q1800  . sodium chloride  3 mL Intravenous Q12H  . warfarin  2 mg Oral ONCE-1800  . Warfarin - Pharmacist Dosing Inpatient   Does not apply q1800    OBJECTIVE  Filed Vitals:   09/20/15 1301 09/20/15 1739 09/20/15 2031 09/21/15 0424  BP: 122/58 114/63 127/61 118/58  Pulse: 92   115  Temp: 97.8 F (36.6 C)  97.5 F (36.4 C) 98.6 F (37 C)  TempSrc: Oral  Oral Oral  Resp: 20  20 20   Height:      Weight:    194 lb 8 oz  (88.225 kg)  SpO2: 99%  99% 96%    Intake/Output Summary (Last 24 hours) at 09/21/15 0928 Last data filed at 09/21/15 0855  Gross per 24 hour  Intake   1540 ml  Output   1100 ml  Net    440 ml   Filed Weights   09/19/15 1446 09/20/15 0646 09/21/15 0424  Weight: 185 lb 12.8 oz (84.278 kg) 190 lb 1.6 oz (86.229 kg) 194 lb 8 oz (88.225 kg)    PHYSICAL EXAM  General: Pleasant, NAD. Oxygen in place. Neuro: Alert and oriented X 3. Moves all extremities spontaneously. Psych: Normal affect. HEENT:  Normal  Neck: Supple without bruits or JVD. Lungs:  Resp regular and unlabored, Less rhonchi than yesterday Heart: Irregular. no s3, s4, or murmurs. Abdomen: Soft, non-tender, non-distended, BS + x 4.  Extremities: No clubbing, cyanosis or edema. DP/PT/Radials 2+ and equal bilaterally.  Accessory Clinical Findings  CBC  Recent Labs  09/21/15 0517  WBC 11.5*  HGB 12.4  HCT 38.6  MCV 88.9  PLT 225   Basic Metabolic Panel  Recent Labs  09/20/15 0314 09/21/15 0517  NA 143 140  K 4.6 4.4  CL 95* 94*  CO2 36* 38*  GLUCOSE 256* 361*  BUN  56* 71*  CREATININE 1.50* 1.48*  CALCIUM 9.3 9.2   Liver Function Tests No results for input(s): AST, ALT, ALKPHOS, BILITOT, PROT, ALBUMIN in the last 72 hours. No results for input(s): LIPASE, AMYLASE in the last 72 hours. Cardiac Enzymes No results for input(s): CKTOTAL, CKMB, CKMBINDEX, TROPONINI in the last 72 hours. BNP Invalid input(s): POCBNP D-Dimer No results for input(s): DDIMER in the last 72 hours. Hemoglobin A1C No results for input(s): HGBA1C in the last 72 hours. Fasting Lipid Panel No results for input(s): CHOL, HDL, LDLCALC, TRIG, CHOLHDL, LDLDIRECT in the last 72 hours. Thyroid Function Tests No results for input(s): TSH, T4TOTAL, T3FREE, THYROIDAB in the last 72 hours.  Invalid input(s): FREET3  TELE  Atrial fibrillation rate 100 bpm  ECG    Radiology/Studies  Ct Angio Chest Pe W/cm &/or Wo  Cm  09/14/2015  CLINICAL DATA:  Hypoxia. History of congestive heart failure and atrial fibrillation EXAM: CT ANGIOGRAPHY CHEST WITH CONTRAST TECHNIQUE: Multidetector CT imaging of the chest was performed using the standard protocol during bolus administration of intravenous contrast. Multiplanar CT image reconstructions and MIPs were obtained to evaluate the vascular anatomy. CONTRAST:  85mL OMNIPAQUE IOHEXOL 350 MG/ML SOLN COMPARISON:  Chest x-ray 09/13/2013 FINDINGS: Negative for pulmonary embolism. Mild pulmonary artery enlargement likely due to COPD. Prior CABG. Coronary calcification. Mild cardiac enlargement. Ascending aorta mildly dilated at 43 mm. Descending thoracic aorta atherosclerotic without aneurysm. No pericardial effusion Left lower lobe mass lesion measures 24 x 30 mm. There is central fluid density with surrounding enhancing soft tissue density. Two areas of sub solid density right upper lobe anteriorly measuring approximately 1 cm each. Right lower lobe 8 mm subpleural nodule axial image 131 series 506. Negative for pleural effusion. Precarinal lymph node measures 17 x 22 mm. No mediastinal mass. No hilar adenopathy. Upper abdomen reveals no acute abnormality. No acute skeletal abnormality. Review of the MIP images confirms the above findings. IMPRESSION: Negative for pulmonary embolism. Left lower lobe mass lesion with central fluid density. This may represent carcinoma of the lung. Lung abscess, pneumonia, and other benign etiologies also possible. Three lung nodules on the right as above. Differential includes malignancy and infection and scarring. Close follow-up recommended. Electronically Signed   By: Marlan Palauharles  Clark M.D.   On: 09/14/2015 10:51   Dg Chest Port 1 View  09/18/2015  CLINICAL DATA:  Dyspnea. EXAM: PORTABLE CHEST 1 VIEW COMPARISON:  Most recent radiographs yesterday at 0452 hour. Chest CT 09/14/2015 FINDINGS: Patient has been extubated. Significant rotation to the right. Volume  loss at the left lung base with dense basilar opacity, unchanged. Probable adjacent pleural effusion. Cardiomegaly is grossly stable. Interstitial edema has progressed. A skin fold projects over the right hemithorax. No pneumothorax. IMPRESSION: 1. Persistent volume loss in the left hemithorax with dense left basilar opacity, atelectasis/collapse versus pneumonia. Probable left pleural effusion. 2. Grossly stable cardiomegaly.  Mild increased interstitial edema. Electronically Signed   By: Rubye OaksMelanie  Ehinger M.D.   On: 09/18/2015 01:27   Dg Chest Port 1 View  09/17/2015  CLINICAL DATA:  Acute respiratory failure with hypoxia EXAM: PORTABLE CHEST 1 VIEW COMPARISON:  Chest x-rays dated 09/16/2015 and 09/15/2015. FINDINGS: Endotracheal tube well positioned with tip just above the level of the carina. Enteric tube passes below the diaphragm. Cardiomegaly is unchanged. Dense opacity at the left lung base persists, most suggestive of pneumonia or airspace collapse. Suspect small adjacent pleural effusion. Mild bile interstitial edema is unchanged. No new lung findings. No pneumothorax.  IMPRESSION: 1.  Overall, no interval change. 2. Stable dense opacity at the left lung base, most likely airspace collapse or pneumonia. Suspect small adjacent pleural effusion. 3. Cardiomegaly with stable mild bilateral interstitial edema suggesting mild volume overload/CHF. Electronically Signed   By: Bary Richard M.D.   On: 09/17/2015 08:53   Portable Chest Xray  09/16/2015  CLINICAL DATA:  Shortness of breath EXAM: PORTABLE CHEST 1 VIEW COMPARISON:  Chest radiograph from one day prior. FINDINGS: Left rotated chest radiograph. Endotracheal tube tip is 2.5 cm above the carina. Enteric tube enters the stomach with the tip not seen on this image. Stable cardiomediastinal silhouette with mild cardiomegaly. No pneumothorax. No right pleural effusion. Stable blunting of the left costophrenic angle, likely a small left pleural effusion.  Stable volume loss and consolidation at the left lung base. Mild linear parahilar opacities appear stable and likely represent mild pulmonary edema. IMPRESSION: 1. Endotracheal and enteric tubes appear well-positioned. 2. Stable volume loss and consolidation at the left lung base, which could represent a combination of atelectasis, pneumonia and/or lung mass. 3. Likely small left pleural effusion. 4. Stable mild cardiomegaly with mild pulmonary edema. Electronically Signed   By: Delbert Phenix M.D.   On: 09/16/2015 07:38   Portable Chest Xray  09/15/2015  CLINICAL DATA:  Ventilator dependent respiratory failure. EXAM: PORTABLE CHEST 1 VIEW COMPARISON:  Earlier same day FINDINGS: Endotracheal tube has its tip 3 cm above the carina. Nasogastric tube enters the abdomen. Left lower lobe pneumonia/ collapse persists, with collapse being more complete. Right lung well aerated. IMPRESSION: Endotracheal tube and nasogastric tube well positioned. Left lower lobe pneumonia/collapse persists, with collapse being more complete. Electronically Signed   By: Paulina Fusi M.D.   On: 09/15/2015 08:11   Dg Chest Port 1 View  09/15/2015  CLINICAL DATA:  Acute onset of shortness of breath. Initial encounter. EXAM: PORTABLE CHEST 1 VIEW COMPARISON:  Chest radiograph and CTA of the chest from 09/13/2014 FINDINGS: The lungs are well-aerated. Left basilar airspace opacity may reflect pneumonia or asymmetric pulmonary edema. Underlying vascular congestion is noted. A small left pleural effusion is noted. No pneumothorax is seen. The right costophrenic angle is incompletely imaged on this study. The cardiomediastinal silhouette is is mildly enlarged. The patient is status post CABG. No acute osseous abnormalities are seen. IMPRESSION: Left basilar airspace opacity may reflect pneumonia or asymmetric pulmonary edema. Vascular congestion and mild cardiomegaly. Small left pleural effusion noted. Electronically Signed   By: Roanna Raider M.D.    On: 09/15/2015 04:44   Dg Chest Port 1 View  09/14/2015  CLINICAL DATA:  Acute onset of respiratory distress and shortness of breath. Initial encounter. EXAM: PORTABLE CHEST 1 VIEW COMPARISON:  None. FINDINGS: The lungs are well-aerated. Vascular congestion is noted. Mildly increased interstitial markings may reflect mild interstitial edema. No definite pleural effusion or pneumothorax is seen. The cardiomediastinal silhouette is mildly enlarged. The patient is status post CABG. No acute osseous abnormalities are seen. IMPRESSION: Vascular congestion and mild cardiomegaly. Mildly increased interstitial markings may reflect mild interstitial edema. Electronically Signed   By: Roanna Raider M.D.   On: 09/14/2015 01:59    ASSESSMENT AND PLAN 1. Chronic Atrial Fibrillation - This patients CHA2DS2-VASc Score and unadjusted Ischemic Stroke Rate (% per year) is equal to 11.2 % stroke rate/year from a score of 7 (CHF, HTN, DM, Vascular, Female, Age > 75 (2)). Continue Coumadin for anticoagulation. Metoprolol for rate control. 2. Left lower lobe mass Will need  further evaluation as an outpatient 3. Coronary artery disease with history of CABG in 2012 at [facility]. She has a flail chest. Mild elevation of troponins with peak 0.07 and a plateau pattern. Consider nuclear stress test as an outpatient 4. Acute on chronic combined systolic and diastolic congestive heart failure. Appears euvolemic 5. Acute on chronic renal failure. Serum creatinine appears to be leveling off. Management per primary team. May need to cut back further on Lasix. She appears to be euvolemic.  Anticipate transfer to SNF soon. Signed, Ronny Flurry MD

## 2015-09-21 NOTE — Progress Notes (Signed)
[facility] PROGRESS NOTE  Lori Harrington ZOX:[id number] DOB: Aug 02, 1930 DOA: 09/14/2015 PCP: No primary care provider on file.  Assessment/Plan: #1 acute hypoxic respiratory failure secondary to acute systolic CHF exacerbation and acute bronchitis Patient was initially intubated and subsequently was on BiPAP which has improved. Patient currently off BiPAP with clinical improvement. 2-D echo which was obtained with EF of 40-45% with mild aortic valvular stenosis. Patient diuresed well and is -12.047 L during his hospitalization. Patient with worsening/increasing creatinine yesterday and a such patient was changed to oral Lasix. Renal function with creatinine currently at 1.48. Decrease Lasix to 40 mg twice daily. Continue Mucinex, BiDil, Levaquin, steroid taper, Toprol-XL, Zocor. Continue nebs. Follow.  #2 atrial fibrillation CHA2DS2VASC score 7.Currently on oral metoprolol for rate control.  heart rate in the 100s. INR now therapeutic at 2.89. Resume Coumadin. Coumadin per pharmacy. Cardiology following.  #3 ACUTE ON CHRONIC RENAL FAILURE PATIENT HAD A BUMP IN HER CREATININE UP TO 1.50. IV DIURETICS WERE CHANGED TO ORAL DIURETICS WITH NO SIGNIFICANT CHANGE IN RENAL FUNCTION. WE'LL DECREASE LASIX TO 40 MG TWICE DAILY. FOLLOW.  #4 LUNG MASS PER PULMONARY REPEAT CT CHEST AS OUTPATIENT.  #5 DYSPHAGIA CONTINUE CURRENT DYSPHAGIA DIET.  #6 HYPOKALEMIA REPLETED.  #7 THROMBOCYTOPENIA CHRONIC. NO BLEEDING. FOLLOW.  #8 HYPOTHYROIDISM CONTINUE SYNTHROID.  #9 DIABETES MELLITUS HEMOGLOBIN A1C 7.9. CBGS RANGING FROM 253-291. Increase Lantus to 25 units daily. Continue sliding scale insulin.  #10 coronary artery disease status post CABG in 2012 at [facility] Patient with mildly elevated troponins that peaked at 0.079 currently plateaued. Cardiology following the recommended nuclear stress test as outpatient.  #11 chronic pain Continue current pain regimen. Follow.  #12 history of  gout Resumed allopurinol.  #13 prophylaxis INR currently therapeutic at 2.89 for DVT prophylaxis. Coumadin on hold.   Code Status: DO NOT RESUSCITATE Family Communication: Updated patient. No family at bedside. Disposition Plan: Remain on telemetry.Hopefully to SNF tomorrow.   Consultants:  PCCM admit  Cardiology: Dr. Eldridge Dace, 1/06/ 2017  Procedures:  CT angiogram chest 09/14/2015  Chest x-ray 09/18/2015, 09/17/2015, 09/16/2015, 09/15/2015, 09/14/2015  2-D echo 09/14/2015  Antibiotics:  IV Levaquin 09/15/2015 >>>oral levaquin.  HPI/Subjective: Patient states SOB improving. No CP.  Objective: Filed Vitals:   09/21/15 0929 09/21/15 1100  BP: 141/73 114/57  Pulse: 103 66  Temp:    Resp:  18    Intake/Output Summary (Last 24 hours) at 09/21/15 1143 Last data filed at 09/21/15 1020  Gross per 24 hour  Intake   1540 ml  Output   1500 ml  Net     40 ml   Filed Weights   09/19/15 1446 09/20/15 0646 09/21/15 0424  Weight: 84.278 kg (185 lb 12.8 oz) 86.229 kg (190 lb 1.6 oz) 88.225 kg (194 lb 8 oz)    Exam:   General:  NAD  Cardiovascular: Irregularly irregular  Respiratory: Some rales in left base. No wheezing. Flial chest noted.  Abdomen: Soft/NT/ND/+BS  Musculoskeletal: No c/c/e   Data Reviewed: Basic Metabolic Panel:  Recent Labs Lab 09/15/15 0730 09/15/15 1045 09/15/15 1258  09/17/15 0237 09/18/15 0734 09/19/15 0222 09/20/15 0314 09/21/15 0517  NA 134* 134*  --   < > 140 146* 145 143 140  K 4.5 4.1  --   < > 3.4* 3.9 4.8 4.6 4.4  CL 93* 92*  --   < > 96* 100* 101 95* 94*  CO2 32 31  --   < > 35* 36* 28 36* 38*  GLUCOSE 183*  126*  --   < > 266* 196* 254* 256* 361*  BUN 37* 40*  --   < > 36* 30* 34* 56* 71*  CREATININE 1.25* 1.25*  --   < > 1.13* 1.15* 1.19* 1.50* 1.48*  CALCIUM 9.0 8.8*  --   < > 8.6* 8.9 9.1 9.3 9.2  MG 2.0 2.0  --   --   --   --   --   --   --   PHOS 4.3  --  2.0*  --   --   --   --   --   --   < > = values in this  interval not displayed. Liver Function Tests:  Recent Labs Lab 09/16/15 0218 09/17/15 0237  AST 34 30  ALT 22 20  ALKPHOS 60 59  BILITOT 0.8 1.0  PROT 5.9* 6.1*  ALBUMIN 3.0* 2.9*   No results for input(s): LIPASE, AMYLASE in the last 168 hours. No results for input(s): AMMONIA in the last 168 hours. CBC:  Recent Labs Lab 09/15/15 0730 09/17/15 0237 09/18/15 0546 09/21/15 0517  WBC 10.7* 11.5* 13.2* 11.5*  NEUTROABS  --  7.3  --   --   HGB 11.9* 12.4 13.0 12.4  HCT 38.2 37.5 39.3 38.6  MCV 89.9 87.0 88.7 88.9  PLT 157 168 390 225   Cardiac Enzymes:  Recent Labs Lab 09/15/15 0730 09/15/15 1045 09/15/15 1640 09/15/15 1902  TROPONINI 0.05* 0.06* 0.06* 0.07*   BNP (last 3 results)  Recent Labs  09/14/15 0108 09/15/15 0730  BNP 746.6* 381.9*    ProBNP (last 3 results) No results for input(s): PROBNP in the last 8760 hours.  CBG:  Recent Labs Lab 09/20/15 1631 09/20/15 2112 09/20/15 2332 09/21/15 0424 09/21/15 0859  GLUCAP 180* 209* 288* 288* 253*    Recent Results (from the past 240 hour(s))  MRSA PCR Screening     Status: None   Collection Time: 09/14/15  3:09 PM  Result Value Ref Range Status   MRSA by PCR NEGATIVE NEGATIVE Final    Comment:        The GeneXpert MRSA Assay (FDA approved for NASAL specimens only), is one component of a comprehensive MRSA colonization surveillance program. It is not intended to diagnose MRSA infection nor to guide or monitor treatment for MRSA infections.   MRSA PCR Screening     Status: None   Collection Time: 09/15/15  4:24 AM  Result Value Ref Range Status   MRSA by PCR NEGATIVE NEGATIVE Final    Comment:        The GeneXpert MRSA Assay (FDA approved for NASAL specimens only), is one component of a comprehensive MRSA colonization surveillance program. It is not intended to diagnose MRSA infection nor to guide or monitor treatment for MRSA infections.      Studies: No results  found.  Scheduled Meds: . antiseptic oral rinse  7 mL Mouth Rinse BID  . feeding supplement (GLUCERNA SHAKE)  237 mL Oral TID BM  . furosemide  40 mg Oral BID  . guaiFENesin  1,200 mg Oral BID  . insulin aspart  0-15 Units Subcutaneous 6 times per day  . insulin glargine  25 Units Subcutaneous QHS  . isosorbide-hydrALAZINE  1 tablet Oral TID  . levothyroxine  125 mcg Oral QAC breakfast  . metoprolol succinate  25 mg Oral BID  . nitroGLYCERIN  0.5 inch Topical 4 times per day  . predniSONE  60 mg Oral QAC breakfast  .  simvastatin  20 mg Oral q1800  . sodium chloride  3 mL Intravenous Q12H  . warfarin  2 mg Oral ONCE-1800  . Warfarin - Pharmacist Dosing Inpatient   Does not apply q1800   Continuous Infusions: . sodium chloride 10 mL/hr at 09/20/15 [id number]    Principal Problem:   Acute respiratory failure with hypoxia (HCC) Active Problems:   Acute on chronic combined systolic and diastolic CHF (congestive heart failure) (HCC)   Acute exacerbation of CHF (congestive heart failure) (HCC)   Diabetes mellitus, type 2 (HCC)   Acute respiratory failure (HCC)   Gout   Hypothyroid   HTN (hypertension)   Thrombocytopenia (HCC)   Heart failure (HCC)   Acute on chronic congestive heart failure (HCC)   Acute pulmonary edema (HCC)   Hypokalemia   AKI (acute kidney injury) (HCC)   Muscular deconditioning   Chronic atrial fibrillation (HCC)   Renal failure (ARF), acute on chronic (HCC)   Acute bronchitis    Time spent: 35 mins    [facility] Phys[facility] PaHOMPSON,Eupha Lobb MD [facility] Pager [phone]. If 7PM-7AM, please contact night-coverage at [email or web address], password [email or web address] 09/21/2015, 11:43 AM  LOS: 7 days

## 2015-09-21 NOTE — Progress Notes (Signed)
Patient discussed with Dr. Janee Mornhompson- anticipates d/c tomorrow if medically stable.  [facility] Admissions Randa EvensJoanne. Can take tomorrow.  Patient defers to son Virl DiamondChuck who was notified; states either he or his sister Steward DroneBrenda can complete d/c paperwork at the facility tomorrow.  His number is [phone]. Lorri Frederickonna T. Jaci LazierCrowder, KentuckyLCSW [phone]

## 2015-09-21 NOTE — Progress Notes (Signed)
Physical Therapy Treatment Patient Details Name: Lori Harrington MRN: [id number] DOB: [birth year] Today's Date: 09/21/2015    History of Present Illness Patient is a 82 y/o female with hx of CHF, mitral valve disorder and A-fib admitted with acute on chronic respiratory failure requiring intubation. Intubated 1/6-1/8    PT Comments    Pt was seen for evaluation of her current mobility and still is very appropriate for SNF due to lack of standing initiation and control of mobility.  Her plan is to get stronger for home, where she will need assistance due to her emerging concerning conditions with her lungs.  Follow Up Recommendations  SNF     Equipment Recommendations  None recommended by PT (await SNF disposition)    Recommendations for Other Services       Precautions / Restrictions Precautions Precautions: Fall Restrictions Weight Bearing Restrictions: No    Mobility  Bed Mobility Overal bed mobility: Needs Assistance Bed Mobility: Supine to Sit     Supine to sit: Mod assist;HOB elevated (using bedrail)     General bed mobility comments: Assisted her with bed pad to finish scooting out to EOB, could not think through her steps of the transition  Transfers Overall transfer level: Needs assistance Equipment used: Rolling walker (2 wheeled);1 person hand held assist Transfers: Sit to/from [employer] Sit to Stand: Mod assist Stand pivot transfers: Min assist;Mod assist       General transfer comment: Had given pt multiple recues for standing, has no idea how to initiate and kept trying to just scoot out more to EOB  Ambulation/Gait Ambulation/Gait assistance: Mod assist Ambulation Distance (Feet): 3 Feet Assistive device: 1 person hand held assist;Rolling walker (2 wheeled) Gait Pattern/deviations: Step-to pattern;Shuffle;Wide base of support;Trunk flexed;Ataxic Gait velocity: very reduced Gait velocity interpretation: Below normal speed for  age/gender General Gait Details: slow sidesteps to chair with reminders for posture and safety, hand placement to transition both verbally and with tactile cues   Stairs            Wheelchair Mobility    Modified Rankin (Stroke Patients Only)       Balance Overall balance assessment: Needs assistance Sitting-balance support: Feet supported Sitting balance-Leahy Scale: Fair       Standing balance-Leahy Scale: Poor                      Cognition Arousal/Alertness: Awake/alert Behavior During Therapy: WFL for tasks assessed/performed Overall Cognitive Status: Within Functional Limits for tasks assessed                      Exercises      General Comments General comments (skin integrity, edema, etc.): Pt is very slow to respond to mobility prompts, has safety concerns with standing and controlling standing.  tends to wait for PT to help initiate any mobility      Pertinent Vitals/Pain Pain Assessment: No/denies pain    Home Living                      Prior Function            PT Goals (current goals can now be found in the care plan section) Acute Rehab PT Goals Patient Stated Goal: to get better and go home Potential to Achieve Goals: Good Progress towards PT goals: Progressing toward goals    Frequency  Min 3X/week    PT Plan Current plan remains appropriate  Co-evaluation             End of Session Equipment Utilized During Treatment: Gait belt;Oxygen Activity Tolerance: Patient tolerated treatment well Patient left: in chair;with call bell/phone within reach;with chair alarm set     Time: 1215-1239 PT Time Calculation (min) (ACUTE ONLY): 24 min  Charges:  $Gait Training: 8-22 mins $Therapeutic Activity: 8-22 mins                    G Codes:      Ivar DrapeStout, Adetokunbo Mccadden E 09/21/2015, 1:12 PM   Samul Dadauth Naw Lasala, PT MS Acute Rehab Dept. Number: [facility] [phone] and [facility] [phone]

## 2015-09-21 NOTE — Progress Notes (Signed)
Results for Marilynn RailCOLLINS, Rhiann A (MRN [id number]) as of 09/21/2015 08:29  Ref. Range 09/20/2015 11:51 09/20/2015 16:31 09/20/2015 21:12 09/20/2015 23:32 09/21/2015 04:24  Glucose-Capillary Latest Ref Range: 65-99 mg/dL 409214 (H) 811180 (H) 914209 (H) 288 (H) 288 (H)  Noted that blood sugars still greater than 180 mg/dl. Recommend increasing Lantus to 25 units daily and titrate up as needed. Patient does take Lantus 40-44 U daily at home. Continue Novolog MODERATE correction scale, but if eating, may want to change times to TID & HS. Will continue to monitor blood sugars. Smith MinceKendra Math Brazie RN BSN CDE

## 2015-09-21 NOTE — Discharge Instructions (Signed)

## 2015-09-21 NOTE — Progress Notes (Signed)
ANTICOAGULATION CONSULT NOTE - Follow Up Consult  Pharmacy Consult for warfarin Indication: atrial fibrillation  Allergies  Allergen Reactions  . Adhesive [Tape]     Skin turns red and itching   . Codeine     Stop breathing   . Oxycodone     unknown    Patient Measurements: Height: 5' 9.5" (176.5 cm) Weight: 194 lb 8 oz (88.225 kg) (bedscale) IBW/kg (Calculated) : 67.35  Vital Signs: Temp: 98.6 F (37 C) (01/12 0424) Temp Source: Oral (01/12 0424) BP: 118/58 mmHg (01/12 0424) Pulse Rate: 115 (01/12 0424)  Labs:  Recent Labs  09/19/15 0222 09/19/15 0445 09/20/15 0314 09/21/15 0517  HGB  --   --   --  12.4  HCT  --   --   --  38.6  PLT  --   --   --  225  LABPROT >90.0* 28.5* 39.7* 29.7*  INR >10.00* 2.73* 4.24* 2.89*  CREATININE 1.19*  --  1.50* 1.48*    Estimated Creatinine Clearance: 34.4 mL/min (by C-G formula based on Cr of 1.48).  Assessment: 82 yo F admitted 09/14/2015 with SOB and respiratory distress on warfarin PTA for Afib. INR 2.89 today, Hgb 12.4, Plt 225. No bleeding noted.  Pt continues on levaquin which may increase the INR. And PO intake is low  PTA warfarin: 4 mg daily  Goal of Therapy:  INR 2-3 Monitor platelets by anticoagulation protocol: Yes   Plan:  Plan: - Restart warfarin 2 mg dose x 1  - Daily INR  Thank you for allowing us to participate in this patients care. Signe Coltonya C Sakina Briones, PharmD  09/21/2015 8:32 AM

## 2015-09-22 DIAGNOSIS — E119 Type 2 diabetes mellitus without complications: Secondary | ICD-10-CM | POA: Insufficient documentation

## 2015-09-22 DIAGNOSIS — J208 Acute bronchitis due to other specified organisms: Secondary | ICD-10-CM

## 2015-09-22 LAB — BASIC METABOLIC PANEL
ANION GAP: 6 (ref 5–15)
BUN: 70 mg/dL — ABNORMAL HIGH (ref 6–20)
CALCIUM: 9.1 mg/dL (ref 8.9–10.3)
CO2: 38 mmol/L — ABNORMAL HIGH (ref 22–32)
CREATININE: 1.4 mg/dL — AB (ref 0.44–1.00)
Chloride: 93 mmol/L — ABNORMAL LOW (ref 101–111)
GFR, EST AFRICAN AMERICAN: 39 mL/min — AB (ref >60)
GFR, EST NON AFRICAN AMERICAN: 34 mL/min — AB (ref >60)
GLUCOSE: 304 mg/dL — AB (ref 65–99)
Potassium: 4 mmol/L (ref 3.5–5.1)
Sodium: 137 mmol/L (ref 135–145)

## 2015-09-22 LAB — GLUCOSE, CAPILLARY
GLUCOSE-CAPILLARY: 221 mg/dL — AB (ref 65–99)
Glucose-Capillary: 262 mg/dL — ABNORMAL HIGH (ref 65–99)
Glucose-Capillary: 239 mg/dL — ABNORMAL HIGH (ref 65–99)

## 2015-09-22 LAB — PROTIME-INR
INR: 2.02 — AB (ref 0.00–1.49)
PROTHROMBIN TIME: 22.8 s — AB (ref 11.6–15.2)

## 2015-09-22 MED ORDER — FUROSEMIDE 40 MG PO TABS: 40.0000 mg | ORAL_TABLET | Freq: Two times a day (BID) | ORAL | Status: AC

## 2015-09-22 MED ORDER — PREDNISONE 20 MG PO TABS: 20.0000 mg | ORAL_TABLET | Freq: Every day | ORAL | Status: DC

## 2015-09-22 MED ORDER — GUAIFENESIN ER 600 MG PO TB12: 1200.0000 mg | ORAL_TABLET | Freq: Two times a day (BID) | ORAL | Status: AC

## 2015-09-22 MED ORDER — WARFARIN SODIUM 2 MG PO TABS: 4.0000 mg | ORAL_TABLET | Freq: Once | ORAL | Status: DC

## 2015-09-22 MED ORDER — SENNOSIDES-DOCUSATE SODIUM 8.6-50 MG PO TABS: 1.0000 | ORAL_TABLET | Freq: Every day | ORAL | Status: AC

## 2015-09-22 MED ORDER — INSULIN GLARGINE 100 UNIT/ML ~~LOC~~ SOLN
35.0000 [IU] | Freq: Every day | SUBCUTANEOUS | Status: DC
  Filled 2015-09-22: qty 0.35

## 2015-09-22 MED ORDER — ISOSORB DINITRATE-HYDRALAZINE 20-37.5 MG PO TABS: 1.0000 | ORAL_TABLET | Freq: Three times a day (TID) | ORAL | Status: DC

## 2015-09-22 MED ORDER — GLUCERNA SHAKE PO LIQD: 237.0000 mL | Freq: Three times a day (TID) | ORAL | Status: AC

## 2015-09-22 MED ORDER — LEVOFLOXACIN 750 MG PO TABS: 750.0000 mg | ORAL_TABLET | ORAL | Status: AC

## 2015-09-22 MED ORDER — METOPROLOL SUCCINATE ER 25 MG PO TB24: 25.0000 mg | ORAL_TABLET | Freq: Two times a day (BID) | ORAL | Status: AC

## 2015-09-22 MED ORDER — GUAIFENESIN 100 MG/5ML PO SOLN: 5.0000 mL | ORAL | Status: AC | PRN

## 2015-09-22 MED ORDER — TRAMADOL HCL 50 MG PO TABS: 50.0000 mg | ORAL_TABLET | Freq: Four times a day (QID) | ORAL | Status: AC | PRN

## 2015-09-22 NOTE — Progress Notes (Signed)
ANTICOAGULATION CONSULT NOTE - Follow Up Consult  Pharmacy Consult for warfarin Indication: atrial fibrillation  Allergies  Allergen Reactions  . Adhesive [Tape]     Skin turns red and itching   . Codeine     Stop breathing   . Oxycodone     unknown    Patient Measurements: Height: 5' 9.5" (176.5 cm) Weight: 190 lb 6.4 oz (86.365 kg) (bed scale) IBW/kg (Calculated) : 67.35  Vital Signs: Temp: 97.9 F (36.6 C) (01/13 0602) Temp Source: Oral (01/13 0602) BP: 113/74 mmHg (01/13 0602) Pulse Rate: 91 (01/13 0602)  Labs:  Recent Labs  09/20/15 0314 09/21/15 0517 09/22/15 0455  HGB  --  12.4  --   HCT  --  38.6  --   PLT  --  225  --   LABPROT 39.7* 29.7* 22.8*  INR 4.24* 2.89* 2.02*  CREATININE 1.50* 1.48* 1.40*    Estimated Creatinine Clearance: 36 mL/min (by C-G formula based on Cr of 1.4).  Assessment: 82 yo F admitted 09/14/2015 with SOB and respiratory distress on warfarin PTA for Afib. INR 2.02 today, 1/12: Hgb 12.4, Plt 225. No bleeding noted.  Pt s/p levaquin x 7 days (1/6-1/12) which may increase the INR. And PO intake is low  PTA warfarin: 4 mg daily  Goal of Therapy:  INR 2-3 Monitor platelets by anticoagulation protocol: Yes   Plan:  - Give warfarin 4 mg dose x 1  - Daily INR  Thank you for allowing us to participate in this patients care. Signe Coltonya C Octavie Westerhold, PharmD  09/22/2015 8:20 AM

## 2015-09-22 NOTE — Progress Notes (Signed)
Patient Name: Lori RailJoyce A Human Date of Encounter: 09/22/2015     Principal Problem:   Acute respiratory failure with hypoxia (HCC) Active Problems:   Acute exacerbation of CHF (congestive heart failure) (HCC)   Diabetes mellitus, type 2 (HCC)   Acute respiratory failure (HCC)   Gout   Hypothyroid   HTN (hypertension)   Thrombocytopenia (HCC)   Heart failure (HCC)   Acute on chronic congestive heart failure (HCC)   Acute pulmonary edema (HCC)   Hypokalemia   AKI (acute kidney injury) (HCC)   Muscular deconditioning   Chronic atrial fibrillation (HCC)   Renal failure (ARF), acute on chronic (HCC)   Acute on chronic combined systolic and diastolic CHF (congestive heart failure) (HCC)   Acute bronchitis    SUBJECTIVE  The patient has no complaints today.  No respiratory distress.  Her oxygen requirements are improving but she still needs 2 L/min. Rhythm stable atrial fibrillation with heart rate in the 90s.  Denies chest pain or shortness of breath.  CURRENT MEDS . antiseptic oral rinse  7 mL Mouth Rinse BID  . feeding supplement (GLUCERNA SHAKE)  237 mL Oral TID BM  . furosemide  40 mg Oral BID  . guaiFENesin  1,200 mg Oral BID  . insulin aspart  0-15 Units Subcutaneous TID WC  . insulin glargine  35 Units Subcutaneous QHS  . isosorbide-hydrALAZINE  1 tablet Oral TID  . levothyroxine  125 mcg Oral QAC breakfast  . metoprolol succinate  25 mg Oral BID  . nitroGLYCERIN  0.5 inch Topical 4 times per day  . predniSONE  60 mg Oral QAC breakfast  . senna-docusate  1 tablet Oral QHS  . simvastatin  20 mg Oral q1800  . sodium chloride  3 mL Intravenous Q12H  . warfarin  4 mg Oral ONCE-1800  . Warfarin - Pharmacist Dosing Inpatient   Does not apply q1800    OBJECTIVE  Filed Vitals:   09/21/15 1835 09/21/15 2039 09/21/15 2040 09/22/15 0602  BP: 137/75  124/65 113/74  Pulse: 94  98 91  Temp:   97.4 F (36.3 C) 97.9 F (36.6 C)  TempSrc:   Oral Oral  Resp:   21     Height:      Weight:    190 lb 6.4 oz (86.365 kg)  SpO2:  87% 95% 95%    Intake/Output Summary (Last 24 hours) at 09/22/15 0945 Last data filed at 09/22/15 0945  Gross per 24 hour  Intake    993 ml  Output   1800 ml  Net   -807 ml   Filed Weights   09/20/15 0646 09/21/15 0424 09/22/15 0602  Weight: 190 lb 1.6 oz (86.229 kg) 194 lb 8 oz (88.225 kg) 190 lb 6.4 oz (86.365 kg)    PHYSICAL EXAM  General: Pleasant, NAD.  Nasal oxygen in place Neuro: Alert and oriented X 3. Moves all extremities spontaneously. Psych: Normal affect. HEENT:  Normal  Neck: Supple without bruits or JVD. Lungs:  Resp regular and unlabored, CTA. Heart: Irregularly irregular no s3, s4, or murmurs. Abdomen: Soft, non-tender, non-distended, BS + x 4.  Extremities: No clubbing, cyanosis or edema. DP/PT/Radials 2+ and equal bilaterally.  Accessory Clinical Findings  CBC  Recent Labs  09/21/15 0517  WBC 11.5*  HGB 12.4  HCT 38.6  MCV 88.9  PLT 225   Basic Metabolic Panel  Recent Labs  09/21/15 0517 09/22/15 0455  NA 140 137  K 4.4 4.0  CL  94* 93*  CO2 38* 38*  GLUCOSE 361* 304*  BUN 71* 70*  CREATININE 1.48* 1.40*  CALCIUM 9.2 9.1   Liver Function Tests No results for input(s): AST, ALT, ALKPHOS, BILITOT, PROT, ALBUMIN in the last 72 hours. No results for input(s): LIPASE, AMYLASE in the last 72 hours. Cardiac Enzymes No results for input(s): CKTOTAL, CKMB, CKMBINDEX, TROPONINI in the last 72 hours. BNP Invalid input(s): POCBNP D-Dimer No results for input(s): DDIMER in the last 72 hours. Hemoglobin A1C No results for input(s): HGBA1C in the last 72 hours. Fasting Lipid Panel No results for input(s): CHOL, HDL, LDLCALC, TRIG, CHOLHDL, LDLDIRECT in the last 72 hours. Thyroid Function Tests No results for input(s): TSH, T4TOTAL, T3FREE, THYROIDAB in the last 72 hours.  Invalid input(s): FREET3  TELE  Atrial fibrillation with ventricular response  90s  ECG    Radiology/Studies  Ct Angio Chest Pe W/cm &/or Wo Cm  09/14/2015  CLINICAL DATA:  Hypoxia. History of congestive heart failure and atrial fibrillation EXAM: CT ANGIOGRAPHY CHEST WITH CONTRAST TECHNIQUE: Multidetector CT imaging of the chest was performed using the standard protocol during bolus administration of intravenous contrast. Multiplanar CT image reconstructions and MIPs were obtained to evaluate the vascular anatomy. CONTRAST:  85mL OMNIPAQUE IOHEXOL 350 MG/ML SOLN COMPARISON:  Chest x-ray 09/13/2013 FINDINGS: Negative for pulmonary embolism. Mild pulmonary artery enlargement likely due to COPD. Prior CABG. Coronary calcification. Mild cardiac enlargement. Ascending aorta mildly dilated at 43 mm. Descending thoracic aorta atherosclerotic without aneurysm. No pericardial effusion Left lower lobe mass lesion measures 24 x 30 mm. There is central fluid density with surrounding enhancing soft tissue density. Two areas of sub solid density right upper lobe anteriorly measuring approximately 1 cm each. Right lower lobe 8 mm subpleural nodule axial image 131 series 506. Negative for pleural effusion. Precarinal lymph node measures 17 x 22 mm. No mediastinal mass. No hilar adenopathy. Upper abdomen reveals no acute abnormality. No acute skeletal abnormality. Review of the MIP images confirms the above findings. IMPRESSION: Negative for pulmonary embolism. Left lower lobe mass lesion with central fluid density. This may represent carcinoma of the lung. Lung abscess, pneumonia, and other benign etiologies also possible. Three lung nodules on the right as above. Differential includes malignancy and infection and scarring. Close follow-up recommended. Electronically Signed   By: Marlan Palau M.D.   On: 09/14/2015 10:51   Dg Chest Port 1 View  09/18/2015  CLINICAL DATA:  Dyspnea. EXAM: PORTABLE CHEST 1 VIEW COMPARISON:  Most recent radiographs yesterday at 0452 hour. Chest CT 09/14/2015 FINDINGS:  Patient has been extubated. Significant rotation to the right. Volume loss at the left lung base with dense basilar opacity, unchanged. Probable adjacent pleural effusion. Cardiomegaly is grossly stable. Interstitial edema has progressed. A skin fold projects over the right hemithorax. No pneumothorax. IMPRESSION: 1. Persistent volume loss in the left hemithorax with dense left basilar opacity, atelectasis/collapse versus pneumonia. Probable left pleural effusion. 2. Grossly stable cardiomegaly.  Mild increased interstitial edema. Electronically Signed   By: Rubye Oaks M.D.   On: 09/18/2015 01:27   Dg Chest Port 1 View  09/17/2015  CLINICAL DATA:  Acute respiratory failure with hypoxia EXAM: PORTABLE CHEST 1 VIEW COMPARISON:  Chest x-rays dated 09/16/2015 and 09/15/2015. FINDINGS: Endotracheal tube well positioned with tip just above the level of the carina. Enteric tube passes below the diaphragm. Cardiomegaly is unchanged. Dense opacity at the left lung base persists, most suggestive of pneumonia or airspace collapse. Suspect small adjacent pleural  effusion. Mild bile interstitial edema is unchanged. No new lung findings. No pneumothorax. IMPRESSION: 1.  Overall, no interval change. 2. Stable dense opacity at the left lung base, most likely airspace collapse or pneumonia. Suspect small adjacent pleural effusion. 3. Cardiomegaly with stable mild bilateral interstitial edema suggesting mild volume overload/CHF. Electronically Signed   By: Bary Richard M.D.   On: 09/17/2015 08:53   Portable Chest Xray  09/16/2015  CLINICAL DATA:  Shortness of breath EXAM: PORTABLE CHEST 1 VIEW COMPARISON:  Chest radiograph from one day prior. FINDINGS: Left rotated chest radiograph. Endotracheal tube tip is 2.5 cm above the carina. Enteric tube enters the stomach with the tip not seen on this image. Stable cardiomediastinal silhouette with mild cardiomegaly. No pneumothorax. No right pleural effusion. Stable blunting of  the left costophrenic angle, likely a small left pleural effusion. Stable volume loss and consolidation at the left lung base. Mild linear parahilar opacities appear stable and likely represent mild pulmonary edema. IMPRESSION: 1. Endotracheal and enteric tubes appear well-positioned. 2. Stable volume loss and consolidation at the left lung base, which could represent a combination of atelectasis, pneumonia and/or lung mass. 3. Likely small left pleural effusion. 4. Stable mild cardiomegaly with mild pulmonary edema. Electronically Signed   By: Delbert Phenix M.D.   On: 09/16/2015 07:38   Portable Chest Xray  09/15/2015  CLINICAL DATA:  Ventilator dependent respiratory failure. EXAM: PORTABLE CHEST 1 VIEW COMPARISON:  Earlier same day FINDINGS: Endotracheal tube has its tip 3 cm above the carina. Nasogastric tube enters the abdomen. Left lower lobe pneumonia/ collapse persists, with collapse being more complete. Right lung well aerated. IMPRESSION: Endotracheal tube and nasogastric tube well positioned. Left lower lobe pneumonia/collapse persists, with collapse being more complete. Electronically Signed   By: Paulina Fusi M.D.   On: 09/15/2015 08:11   Dg Chest Port 1 View  09/15/2015  CLINICAL DATA:  Acute onset of shortness of breath. Initial encounter. EXAM: PORTABLE CHEST 1 VIEW COMPARISON:  Chest radiograph and CTA of the chest from 09/13/2014 FINDINGS: The lungs are well-aerated. Left basilar airspace opacity may reflect pneumonia or asymmetric pulmonary edema. Underlying vascular congestion is noted. A small left pleural effusion is noted. No pneumothorax is seen. The right costophrenic angle is incompletely imaged on this study. The cardiomediastinal silhouette is is mildly enlarged. The patient is status post CABG. No acute osseous abnormalities are seen. IMPRESSION: Left basilar airspace opacity may reflect pneumonia or asymmetric pulmonary edema. Vascular congestion and mild cardiomegaly. Small left  pleural effusion noted. Electronically Signed   By: Roanna Raider M.D.   On: 09/15/2015 04:44   Dg Chest Port 1 View  09/14/2015  CLINICAL DATA:  Acute onset of respiratory distress and shortness of breath. Initial encounter. EXAM: PORTABLE CHEST 1 VIEW COMPARISON:  None. FINDINGS: The lungs are well-aerated. Vascular congestion is noted. Mildly increased interstitial markings may reflect mild interstitial edema. No definite pleural effusion or pneumothorax is seen. The cardiomediastinal silhouette is mildly enlarged. The patient is status post CABG. No acute osseous abnormalities are seen. IMPRESSION: Vascular congestion and mild cardiomegaly. Mildly increased interstitial markings may reflect mild interstitial edema. Electronically Signed   By: Roanna Raider M.D.   On: 09/14/2015 01:59    ASSESSMENT AND PLAN 1. Chronic Atrial Fibrillation - This patients CHA2DS2-VASc Score and unadjusted Ischemic Stroke Rate (% per year) is equal to 11.2 % stroke rate/year from a score of 7 (CHF, HTN, DM, Vascular, Female, Age > 75 (2)). Continue Coumadin  for anticoagulation.  INR is 2.2, therapeutic.  Continue Metoprolol for rate control. 2. Left lower lobe mass Will need further evaluation as an outpatient 3. Coronary artery disease with history of CABG in 2012 at [facility] She has a flail chest. Mild elevation of troponins with peak 0.07 and a plateau pattern. Consider nuclear stress test as an outpatient 4. Acute on chronic combined systolic and diastolic congestive heart failure. Appears euvolemic 5. Acute on chronic renal failure. Serum creatinine appears to be leveling off. Management per primary team. Renal function stable on current dose of Lasix 40 mg twice a day. She appears to be euvolemic.  Plan: Probably transfer to skilled nursing facility today. Signed, Ronny Flurry MD

## 2015-09-22 NOTE — Progress Notes (Signed)
Patient was discharged [facility] at 3:08pm accompanied by 2 [facility] personnel via stretcher/ambulance. All personal belongings given. Patient's stable. No signs and symptoms of distress noted. Report given to Abby prior to discharge. Telemetry box and 2 IV site removed prior to discharge.

## 2015-09-22 NOTE — Progress Notes (Signed)
Patient will discharge to [facility] Anticipated discharge date: 1/13 Family notified: Virl DiamondChuck (son) [facility] by [employer]- scheduled for 2pm  CSW signing off.  Merlyn LotJenna Holoman, LCSWA Clinical Social Worker [phone]

## 2015-09-22 NOTE — Progress Notes (Signed)
SATURATION QUALIFICATIONS: (This note is used to comply with regulatory documentation for home oxygen)  Patient Saturations on Room Air at Rest = 80-85%  Patient Saturations on Room Air while Ambulating = pt unable to ambulate  Patient Saturations on 2 Liters of oxygen while at rest = 97%

## 2015-09-22 NOTE — Progress Notes (Signed)
Speech Language Pathology Treatment: Dysphagia  Patient Details Name: Lori Harrington MRN: [id number] DOB: [birth year] Today's Date: 09/22/2015 Time: 4098-1191 SLP Time Calculation (min) (ACUTE ONLY): 13 min  Assessment / Plan / Recommendation Clinical Impression  Pt seen with am meal, following basic aspiration precautions independently upon arrival. Pt is a very cautious eater, takes small sips and bites independently, despite use of straw. SLP verbally reinforced behaviors. Pt tolerating current diet; does have occasional cough though appears unrelated to PO intake. Education complete and precautions being followed, will sign off at this time. Continue dys 3/thin liquid diet.    HPI HPI: 82 y/o female with CHF, afib admitted with acute on chronic respiratory failure requiring intubation. Intubated 1/6-1/8. Difficulty swallowing pills post extubation.       SLP Plan  All goals met     Recommendations  Diet recommendations: Dysphagia 3 (mechanical soft);Thin liquid Liquids provided via: Straw Medication Administration: Whole meds with puree Supervision: Patient able to self feed Compensations: Slow rate;Small sips/bites Postural Changes and/or Swallow Maneuvers: Seated upright 90 degrees             Plan: All goals met     GO               Herbie Baltimore, MA CCC-SLP [phone]  Lynann Beaver 09/22/2015, 9:19 AM

## 2015-09-22 NOTE — Discharge Summary (Signed)
Physician Discharge Summary  Lori Harrington ZOX:[id number] DOB: 1930/05/02 DOA: 09/14/2015  PCP: No primary care provider on file.  Admit date: 09/14/2015 Discharge date: 09/22/2015  Time spent: 70 minutes  Recommendations for Outpatient Follow-up:  1. Patient be discharged to a skilled nursing facility. Patient is to follow-up with M.D. at the skilled nursing facility. Patient will need a INR checked on Monday, 09/25/2015. 2. Patient is to follow-up with Kandice Robinsons, NP pulmonary on 10/20/2015 for further evaluation of lung mass, and pulmonary status. Patient is being discharged on home O2. 3. Patient is to follow-up with Dr. Darrold Junker, cardiologist in about 2-3 weeks. Patient will likely need outpatient nuclear stress test per cardiology recommendations during his hospitalization.   Discharge Diagnoses:  Principal Problem:   Acute respiratory failure with hypoxia (HCC) Active Problems:   Acute on chronic combined systolic and diastolic CHF (congestive heart failure) (HCC)   Acute exacerbation of CHF (congestive heart failure) (HCC)   Diabetes mellitus, type 2 (HCC)   Acute respiratory failure (HCC)   Gout   Hypothyroid   HTN (hypertension)   Thrombocytopenia (HCC)   Heart failure (HCC)   Acute on chronic congestive heart failure (HCC)   Acute pulmonary edema (HCC)   Hypokalemia   AKI (acute kidney injury) (HCC)   Muscular deconditioning   Chronic atrial fibrillation (HCC)   Renal failure (ARF), acute on chronic (HCC)   Acute bronchitis   Discharge Condition: Stable and improved.  Diet recommendation: Dysphagia 3 diet with carb modified and heart healthy.  Filed Weights   09/20/15 0646 09/21/15 0424 09/22/15 0602  Weight: 86.229 kg (190 lb 1.6 oz) 88.225 kg (194 lb 8 oz) 86.365 kg (190 lb 6.4 oz)    History of present illness:  Per Dr Jodene Nam is a 82 y.o. female with multiple medical problems. She desaturates off Bipap so history was difficult to obtain.  Patient's medical providers are from [facility] where she lives. Patient stated she became SOB 1 day prior to admission. No associated chest pain. She is taking home meds. She was hypoxic in 80's when EMS arrived, placed on bipap. Records reviewed in Care Everywhere. Patient prescribed Levaquin for cough and sinusitis 1 day prior to admission by PCP. Patient was subsequently admitted placed on the BiPAP and placed on IV diuretics.   Hospital Course:  1 acute hypoxic respiratory failure secondary to acute systolic CHF exacerbation and acute bronchitis Patient was admitted with acute respiratory distress and had to be placed on the BiPAP. Patient became obtunded critical care consultation was obtained the patient subsequently intubated. Patient improved clinically and was placed on IV diuretics. Patient was subsequently extubated and transitioned to BiPAP which she improved on. Patient was weaned of BiPAP and on nasal cannula. Patient was subsequently transferred to the floor and back on the hospitalist service. 2-D echo which was obtained with EF of 40-45% with mild aortic valvular stenosis. Patient diuresed well and is -11.894 L during his hospitalization. Patient with worsening/increasing creatinine and as such patient was changed to oral Lasix. Patient's dose of oral diuretics was decreased with improvement in the renal function. Patient was maintained on cardiac regimen of Toprol-XL, Zocor. Patient was also maintained on nebulizers, steroid taper, BiDil, Levaquin. Patient improved clinically and be discharged to a skilled nursing facility with 1 more tablet of oral Levaquin and a steroid taper. Patient was discharged in stable and improved condition. Patient will follow-up with her cardiologist as outpatient.   #2 atrial fibrillation CHA2DS2VASC  score 7.Patient's rate controlled on metoprolol. INR was initially supratherapeutic and coumadin held. Once INR was therapeutic coumadin was resumed. Cardiology  followed throughout the hospitalization. Patient will need INR checked on Monday 09/25/2015.  #3 ACUTE ON CHRONIC RENAL FAILURE PATIENT HAD A BUMP IN HER CREATININE UP TO 1.50. IV DIURETICS WERE CHANGED TO ORAL DIURETICS ND DOSE DECREASED WITH IMPROVEMENT WITH RENAL FUNCTION CLOSE TO BASELINE.  #4 Left lower lobe LUNG MASS Per CT chest. Outpatient follow up per pulmonary.  #5 DYSPHAGIA Patient was seen by SLP. Patient placed on dysphagia diet during the hospitalization. Patient will be discharged on dysphagia 3 diet.  #6 HYPOKALEMIA Repleted.  #7 THROMBOCYTOPENIA CHRONIC. NO BLEEDING.  #8 HYPOTHYROIDISM Patient maintained on home regimen of synthroid.  #9 DIABETES MELLITUS HEMOGLOBIN A1C 7.9. Patient was maintained on Lantus as well as sliding scale insulin during the hospitalization. Outpatient follow-up.  #10 coronary artery disease status post CABG in 2012 at [facility] Patient with mildly elevated troponins that peaked at 0.079 currently plateaued. Cardiology was consulted during the hospitalization and felt patient's mildly elevated troponins were secondary to demand ischemia. EKG did show T-wave inversions in anterior lateral leads. Patient was asymptomatic. Patient was maintained on medical regimen was recommended that patient follow up in the outpatient setting for a nuclear stress test.   #11 chronic pain Continued home  pain regimen. Follow.  #12 history of gout Once patient was out of acute respiratory distress and tolerating oral intake she was resumed on her home regimen of allopurinol.  Procedures:  CT angiogram chest 09/14/2015  Chest x-ray 09/18/2015, 09/17/2015, 09/16/2015, 09/15/2015, 09/14/2015  2-D echo 09/14/2015  Consultations:    PCCM admit  Cardiology: Dr. Eldridge Dace, 1/06/ 2017   Discharge Exam: Filed Vitals:   09/21/15 2040 09/22/15 0602  BP: 124/65 113/74  Pulse: 98 91  Temp: 97.4 F (36.3 C) 97.9 F (36.6 C)  Resp: 21     General:  NAD Cardiovascular: Irregularly, irregular Respiratory: Some scattered coarse BS.  Discharge Instructions   Discharge Instructions    Diet - low sodium heart healthy    Complete by:  As directed   Dysphagia 3 diet.     Diet Carb Modified    Complete by:  As directed   Dysphagia 3 diet.     Discharge instructions    Complete by:  As directed   Follow up with Cardiology in 3 weeks. Follow up Pulmonary as outpatient.     Increase activity slowly    Complete by:  As directed           Current Discharge Medication List    START taking these medications   Details  feeding supplement, GLUCERNA SHAKE, (GLUCERNA SHAKE) LIQD Take 237 mLs by mouth 3 (three) times daily between meals. Refills: 0    guaiFENesin (MUCINEX) 600 MG 12 hr tablet Take 2 tablets (1,200 mg total) by mouth 2 (two) times daily. Take for 3 days then as needed. Qty: 15 tablet, Refills: 0    guaiFENesin (ROBITUSSIN) 100 MG/5ML SOLN Take 5 mLs (100 mg total) by mouth every 4 (four) hours as needed for cough or to loosen phlegm. Qty: 1200 mL, Refills: 0    isosorbide-hydrALAZINE (BIDIL) 20-37.5 MG tablet Take 1 tablet by mouth 3 (three) times daily. Qty: 90 tablet, Refills: 0    metoprolol succinate (TOPROL-XL) 25 MG 24 hr tablet Take 1 tablet (25 mg total) by mouth 2 (two) times daily. Qty: 60 tablet, Refills: 0    predniSONE (DELTASONE)  20 MG tablet Take 1-3 tablets (20-60 mg total) by mouth daily before breakfast. Take 3 tablets (60mg ) daily x 2 day, then 2 tablets (40mg ) daily x 4 days, then 1 tablet (20mg )daily x 4 days, then stop. Qty: 20 tablet, Refills: 0    senna-docusate (SENOKOT-S) 8.6-50 MG tablet Take 1 tablet by mouth at bedtime.      CONTINUE these medications which have CHANGED   Details  furosemide (LASIX) 40 MG tablet Take 1 tablet (40 mg total) by mouth 2 (two) times daily. Qty: 60 tablet, Refills: 0    levofloxacin (LEVAQUIN) 750 MG tablet Take 1 tablet (750 mg total) by mouth every other  day. Take for 10 days Qty: 1 tablet, Refills: 0    traMADol (ULTRAM) 50 MG tablet Take 1 tablet (50 mg total) by mouth every 6 (six) hours as needed for moderate pain. Qty: 30 tablet, Refills: 0      CONTINUE these medications which have NOT CHANGED   Details  allopurinol (ZYLOPRIM) 300 MG tablet Take 300 mg by mouth daily.    Cholecalciferol (VITAMIN D-3) 1000 units CAPS Take 1 capsule by mouth daily.    colchicine 0.6 MG tablet Take 0.6 mg by mouth daily as needed. For gout    docusate sodium (COLACE) 100 MG capsule Take 100 mg by mouth daily.     gabapentin (NEURONTIN) 300 MG capsule Take 300 mg by mouth 3 (three) times daily.    insulin glargine (LANTUS) 100 UNIT/ML injection Inject 40-44 Units into the skin at bedtime.    Insulin Lispro (HUMALOG KWIKPEN [place]) Inject 20-30 units of lipase into the skin See admin instructions. Take 30 units at breakfast, 20 units at lunch, 28 units at supper per sliding scale    levothyroxine (SYNTHROID, LEVOTHROID) 125 MCG tablet Take 125 mcg by mouth daily before breakfast.    metFORMIN (GLUCOPHAGE) 500 MG tablet Take 500 mg by mouth 2 (two) times daily with a meal.    ondansetron (ZOFRAN) 4 MG tablet Take 4 mg by mouth every 8 (eight) hours as needed for nausea or vomiting.    potassium chloride SA (K-DUR,KLOR-CON) 20 MEQ tablet Take 20 mEq by mouth 2 (two) times daily.    simvastatin (ZOCOR) 20 MG tablet Take 20 mg by mouth at bedtime.    vitamin B-12 (CYANOCOBALAMIN) 1000 MCG tablet Take 1,000 mcg by mouth daily.    warfarin (COUMADIN) 4 MG tablet Take 4 mg by mouth daily.      STOP taking these medications     digoxin (LANOXIN) 0.125 MG tablet      lisinopril (PRINIVIL,ZESTRIL) 10 MG tablet      metoprolol (LOPRESSOR) 50 MG tablet        Allergies  Allergen Reactions  . Adhesive [Tape]     Skin turns red and itching   . Codeine     Stop breathing   . Oxycodone     unknown   Follow-up Information    Follow up with Bevelyn Ngo, NP On 10/20/2015.   Specialty:  Pulmonary Disease   Why:  F/U at 9AM   Contact information:   [address] [place] [address] [phone]       Please follow up.   Why:  f/u with MD at SNF      Follow up with PARASCHOS,ALEXANDER, MD. Schedule an appointment as soon as possible for a visit in 2 weeks.   Specialty:  Cardiology   Contact information:  [address] [facility] [place] [id number] [phone]       Please follow up.   Why:  INR check on Monday 09/25/2015       The results of significant diagnostics from this hospitalization (including imaging, microbiology, ancillary and laboratory) are listed below for reference.    Significant Diagnostic Studies: Ct Angio Chest Pe W/cm &/or Wo Cm  09/14/2015  CLINICAL DATA:  Hypoxia. History of congestive heart failure and atrial fibrillation EXAM: CT ANGIOGRAPHY CHEST WITH CONTRAST TECHNIQUE: Multidetector CT imaging of the chest was performed using the standard protocol during bolus administration of intravenous contrast. Multiplanar CT image reconstructions and MIPs were obtained to evaluate the vascular anatomy. CONTRAST:  85mL OMNIPAQUE IOHEXOL 350 MG/ML SOLN COMPARISON:  Chest x-ray 09/13/2013 FINDINGS: Negative for pulmonary embolism. Mild pulmonary artery enlargement likely due to COPD. Prior CABG. Coronary calcification. Mild cardiac enlargement. Ascending aorta mildly dilated at 43 mm. Descending thoracic aorta atherosclerotic without aneurysm. No pericardial effusion Left lower lobe mass lesion measures 24 x 30 mm. There is central fluid density with surrounding enhancing soft tissue density. Two areas of sub solid density right upper lobe anteriorly measuring approximately 1 cm each. Right lower lobe 8 mm subpleural nodule axial image 131 series 506. Negative for pleural effusion. Precarinal lymph node measures 17 x 22 mm. No mediastinal mass. No hilar adenopathy. Upper  abdomen reveals no acute abnormality. No acute skeletal abnormality. Review of the MIP images confirms the above findings. IMPRESSION: Negative for pulmonary embolism. Left lower lobe mass lesion with central fluid density. This may represent carcinoma of the lung. Lung abscess, pneumonia, and other benign etiologies also possible. Three lung nodules on the right as above. Differential includes malignancy and infection and scarring. Close follow-up recommended. Electronically Signed   By: Marlan Palauharles  Clark M.D.   On: 09/14/2015 10:51   Dg Chest Port 1 View  09/18/2015  CLINICAL DATA:  Dyspnea. EXAM: PORTABLE CHEST 1 VIEW COMPARISON:  Most recent radiographs yesterday at 0452 hour. Chest CT 09/14/2015 FINDINGS: Patient has been extubated. Significant rotation to the right. Volume loss at the left lung base with dense basilar opacity, unchanged. Probable adjacent pleural effusion. Cardiomegaly is grossly stable. Interstitial edema has progressed. A skin fold projects over the right hemithorax. No pneumothorax. IMPRESSION: 1. Persistent volume loss in the left hemithorax with dense left basilar opacity, atelectasis/collapse versus pneumonia. Probable left pleural effusion. 2. Grossly stable cardiomegaly.  Mild increased interstitial edema. Electronically Signed   By: Rubye OaksMelanie  Ehinger M.D.   On: 09/18/2015 01:27   Dg Chest Port 1 View  09/17/2015  CLINICAL DATA:  Acute respiratory failure with hypoxia EXAM: PORTABLE CHEST 1 VIEW COMPARISON:  Chest x-rays dated 09/16/2015 and 09/15/2015. FINDINGS: Endotracheal tube well positioned with tip just above the level of the carina. Enteric tube passes below the diaphragm. Cardiomegaly is unchanged. Dense opacity at the left lung base persists, most suggestive of pneumonia or airspace collapse. Suspect small adjacent pleural effusion. Mild bile interstitial edema is unchanged. No new lung findings. No pneumothorax. IMPRESSION: 1.  Overall, no interval change. 2. Stable dense  opacity at the left lung base, most likely airspace collapse or pneumonia. Suspect small adjacent pleural effusion. 3. Cardiomegaly with stable mild bilateral interstitial edema suggesting mild volume overload/CHF. Electronically Signed   By: Bary RichardStan  Maynard M.D.   On: 09/17/2015 08:53   Portable Chest Xray  09/16/2015  CLINICAL DATA:  Shortness of breath EXAM: PORTABLE CHEST 1 VIEW COMPARISON:  Chest radiograph  from one day prior. FINDINGS: Left rotated chest radiograph. Endotracheal tube tip is 2.5 cm above the carina. Enteric tube enters the stomach with the tip not seen on this image. Stable cardiomediastinal silhouette with mild cardiomegaly. No pneumothorax. No right pleural effusion. Stable blunting of the left costophrenic angle, likely a small left pleural effusion. Stable volume loss and consolidation at the left lung base. Mild linear parahilar opacities appear stable and likely represent mild pulmonary edema. IMPRESSION: 1. Endotracheal and enteric tubes appear well-positioned. 2. Stable volume loss and consolidation at the left lung base, which could represent a combination of atelectasis, pneumonia and/or lung mass. 3. Likely small left pleural effusion. 4. Stable mild cardiomegaly with mild pulmonary edema. Electronically Signed   By: Delbert Phenix M.D.   On: 09/16/2015 07:38   Portable Chest Xray  09/15/2015  CLINICAL DATA:  Ventilator dependent respiratory failure. EXAM: PORTABLE CHEST 1 VIEW COMPARISON:  Earlier same day FINDINGS: Endotracheal tube has its tip 3 cm above the carina. Nasogastric tube enters the abdomen. Left lower lobe pneumonia/ collapse persists, with collapse being more complete. Right lung well aerated. IMPRESSION: Endotracheal tube and nasogastric tube well positioned. Left lower lobe pneumonia/collapse persists, with collapse being more complete. Electronically Signed   By: Paulina Fusi M.D.   On: 09/15/2015 08:11   Dg Chest Port 1 View  09/15/2015  CLINICAL DATA:  Acute  onset of shortness of breath. Initial encounter. EXAM: PORTABLE CHEST 1 VIEW COMPARISON:  Chest radiograph and CTA of the chest from 09/13/2014 FINDINGS: The lungs are well-aerated. Left basilar airspace opacity may reflect pneumonia or asymmetric pulmonary edema. Underlying vascular congestion is noted. A small left pleural effusion is noted. No pneumothorax is seen. The right costophrenic angle is incompletely imaged on this study. The cardiomediastinal silhouette is is mildly enlarged. The patient is status post CABG. No acute osseous abnormalities are seen. IMPRESSION: Left basilar airspace opacity may reflect pneumonia or asymmetric pulmonary edema. Vascular congestion and mild cardiomegaly. Small left pleural effusion noted. Electronically Signed   By: Roanna Raider M.D.   On: 09/15/2015 04:44   Dg Chest Port 1 View  09/14/2015  CLINICAL DATA:  Acute onset of respiratory distress and shortness of breath. Initial encounter. EXAM: PORTABLE CHEST 1 VIEW COMPARISON:  None. FINDINGS: The lungs are well-aerated. Vascular congestion is noted. Mildly increased interstitial markings may reflect mild interstitial edema. No definite pleural effusion or pneumothorax is seen. The cardiomediastinal silhouette is mildly enlarged. The patient is status post CABG. No acute osseous abnormalities are seen. IMPRESSION: Vascular congestion and mild cardiomegaly. Mildly increased interstitial markings may reflect mild interstitial edema. Electronically Signed   By: Roanna Raider M.D.   On: 09/14/2015 01:59    Microbiology: Recent Results (from the past 240 hour(s))  MRSA PCR Screening     Status: None   Collection Time: 09/14/15  3:09 PM  Result Value Ref Range Status   MRSA by PCR NEGATIVE NEGATIVE Final    Comment:        The GeneXpert MRSA Assay (FDA approved for NASAL specimens only), is one component of a comprehensive MRSA colonization surveillance program. It is not intended to diagnose MRSA infection nor  to guide or monitor treatment for MRSA infections.   MRSA PCR Screening     Status: None   Collection Time: 09/15/15  4:24 AM  Result Value Ref Range Status   MRSA by PCR NEGATIVE NEGATIVE Final    Comment:  The GeneXpert MRSA Assay (FDA approved for NASAL specimens only), is one component of a comprehensive MRSA colonization surveillance program. It is not intended to diagnose MRSA infection nor to guide or monitor treatment for MRSA infections.      Labs: Basic Metabolic Panel:  Recent Labs Lab 09/15/15 1258  09/18/15 0734 09/19/15 0222 09/20/15 0314 09/21/15 0517 09/22/15 0455  NA  --   < > 146* 145 143 140 137  K  --   < > 3.9 4.8 4.6 4.4 4.0  CL  --   < > 100* 101 95* 94* 93*  CO2  --   < > 36* 28 36* 38* 38*  GLUCOSE  --   < > 196* 254* 256* 361* 304*  BUN  --   < > 30* 34* 56* 71* 70*  CREATININE  --   < > 1.15* 1.19* 1.50* 1.48* 1.40*  CALCIUM  --   < > 8.9 9.1 9.3 9.2 9.1  PHOS 2.0*  --   --   --   --   --   --   < > = values in this interval not displayed. Liver Function Tests:  Recent Labs Lab 09/16/15 0218 09/17/15 0237  AST 34 30  ALT 22 20  ALKPHOS 60 59  BILITOT 0.8 1.0  PROT 5.9* 6.1*  ALBUMIN 3.0* 2.9*   No results for input(s): LIPASE, AMYLASE in the last 168 hours. No results for input(s): AMMONIA in the last 168 hours. CBC:  Recent Labs Lab 09/17/15 0237 09/18/15 0546 09/21/15 0517  WBC 11.5* 13.2* 11.5*  NEUTROABS 7.3  --   --   HGB 12.4 13.0 12.4  HCT 37.5 39.3 38.6  MCV 87.0 88.7 88.9  PLT 168 390 225   Cardiac Enzymes:  Recent Labs Lab 09/15/15 1640 09/15/15 1902  TROPONINI 0.06* 0.07*   BNP: BNP (last 3 results)  Recent Labs  09/14/15 0108 09/15/15 0730  BNP 746.6* 381.9*    ProBNP (last 3 results) No results for input(s): PROBNP in the last 8760 hours.  CBG:  Recent Labs Lab 09/21/15 0859 09/21/15 1109 09/21/15 1620 09/21/15 2034 09/22/15 0609  GLUCAP 253* 291* 231* 260* 262*        Signed:  Dayron Odland MD.  [facility] 09/22/2015, 12:03 PM

## 2015-09-27 ENCOUNTER — Emergency Department: Payer: Medicare Other

## 2015-09-27 ENCOUNTER — Inpatient Hospital Stay
Admission: EM | Admit: 2015-09-27 | Discharge: 2015-10-11 | DRG: 871 | Disposition: E | Payer: Medicare Other | Attending: Internal Medicine | Admitting: Internal Medicine

## 2015-09-27 DIAGNOSIS — J9602 Acute respiratory failure with hypercapnia: Secondary | ICD-10-CM | POA: Diagnosis present

## 2015-09-27 DIAGNOSIS — E872 Acidosis: Secondary | ICD-10-CM | POA: Diagnosis present

## 2015-09-27 DIAGNOSIS — I509 Heart failure, unspecified: Secondary | ICD-10-CM | POA: Diagnosis present

## 2015-09-27 DIAGNOSIS — E871 Hypo-osmolality and hyponatremia: Secondary | ICD-10-CM | POA: Diagnosis present

## 2015-09-27 DIAGNOSIS — Z7901 Long term (current) use of anticoagulants: Secondary | ICD-10-CM | POA: Diagnosis not present

## 2015-09-27 DIAGNOSIS — Z833 Family history of diabetes mellitus: Secondary | ICD-10-CM | POA: Diagnosis not present

## 2015-09-27 DIAGNOSIS — I4891 Unspecified atrial fibrillation: Secondary | ICD-10-CM | POA: Diagnosis present

## 2015-09-27 DIAGNOSIS — E1122 Type 2 diabetes mellitus with diabetic chronic kidney disease: Secondary | ICD-10-CM | POA: Diagnosis present

## 2015-09-27 DIAGNOSIS — Y95 Nosocomial condition: Secondary | ICD-10-CM | POA: Diagnosis present

## 2015-09-27 DIAGNOSIS — Z825 Family history of asthma and other chronic lower respiratory diseases: Secondary | ICD-10-CM | POA: Diagnosis not present

## 2015-09-27 DIAGNOSIS — M109 Gout, unspecified: Secondary | ICD-10-CM | POA: Diagnosis present

## 2015-09-27 DIAGNOSIS — N179 Acute kidney failure, unspecified: Secondary | ICD-10-CM | POA: Diagnosis present

## 2015-09-27 DIAGNOSIS — R401 Stupor: Secondary | ICD-10-CM

## 2015-09-27 DIAGNOSIS — E7800 Pure hypercholesterolemia, unspecified: Secondary | ICD-10-CM | POA: Diagnosis present

## 2015-09-27 DIAGNOSIS — Z8249 Family history of ischemic heart disease and other diseases of the circulatory system: Secondary | ICD-10-CM

## 2015-09-27 DIAGNOSIS — Z79899 Other long term (current) drug therapy: Secondary | ICD-10-CM | POA: Diagnosis not present

## 2015-09-27 DIAGNOSIS — N189 Chronic kidney disease, unspecified: Secondary | ICD-10-CM | POA: Diagnosis present

## 2015-09-27 DIAGNOSIS — Z515 Encounter for palliative care: Secondary | ICD-10-CM | POA: Diagnosis not present

## 2015-09-27 DIAGNOSIS — J9601 Acute respiratory failure with hypoxia: Secondary | ICD-10-CM

## 2015-09-27 DIAGNOSIS — Z66 Do not resuscitate: Secondary | ICD-10-CM | POA: Diagnosis present

## 2015-09-27 DIAGNOSIS — Z794 Long term (current) use of insulin: Secondary | ICD-10-CM | POA: Diagnosis not present

## 2015-09-27 DIAGNOSIS — R6521 Severe sepsis with septic shock: Secondary | ICD-10-CM | POA: Diagnosis present

## 2015-09-27 DIAGNOSIS — J189 Pneumonia, unspecified organism: Secondary | ICD-10-CM | POA: Diagnosis present

## 2015-09-27 DIAGNOSIS — Z96652 Presence of left artificial knee joint: Secondary | ICD-10-CM | POA: Diagnosis present

## 2015-09-27 DIAGNOSIS — A419 Sepsis, unspecified organism: Principal | ICD-10-CM | POA: Diagnosis present

## 2015-09-27 DIAGNOSIS — E119 Type 2 diabetes mellitus without complications: Secondary | ICD-10-CM

## 2015-09-27 DIAGNOSIS — E875 Hyperkalemia: Secondary | ICD-10-CM | POA: Diagnosis present

## 2015-09-27 LAB — BLOOD GAS, VENOUS
FIO2: 1
PH VEN: 6.99 — AB (ref 7.320–7.430)
Patient temperature: 37
pCO2, Ven: >128 mmHg (ref 44.0–60.0)
pO2, Ven: <31 mmHg (ref 30.0–45.0)

## 2015-09-27 LAB — URINALYSIS COMPLETE WITH MICROSCOPIC (ARMC ONLY)
BILIRUBIN URINE: NEGATIVE
Glucose, UA: NEGATIVE mg/dL
HGB URINE DIPSTICK: NEGATIVE
Ketones, ur: NEGATIVE mg/dL
Nitrite: NEGATIVE
PH: 5 (ref 5.0–8.0)
Protein, ur: NEGATIVE mg/dL
Specific Gravity, Urine: 1.011 (ref 1.005–1.030)

## 2015-09-27 LAB — CBC WITH DIFFERENTIAL/PLATELET
BASOS ABS: 0 10*3/uL (ref 0–0.1)
Basophils Relative: 0 %
Eosinophils Absolute: 0 10*3/uL (ref 0–0.7)
Eosinophils Relative: 0 %
HEMATOCRIT: 43.3 % (ref 35.0–47.0)
Hemoglobin: 13.2 g/dL (ref 12.0–16.0)
LYMPHS ABS: 0.4 10*3/uL — AB (ref 1.0–3.6)
MCH: 27.2 pg (ref 26.0–34.0)
MCHC: 30.5 g/dL — AB (ref 32.0–36.0)
MCV: 89.1 fL (ref 80.0–100.0)
MONO ABS: 0.9 10*3/uL (ref 0.2–0.9)
Neutro Abs: 25.3 10*3/uL — ABNORMAL HIGH (ref 1.4–6.5)
Neutrophils Relative %: 96 %
Platelets: 152 10*3/uL (ref 150–440)
RBC: 4.86 MIL/uL (ref 3.80–5.20)
RDW: 16.4 % — AB (ref 11.5–14.5)
WBC: 26.7 10*3/uL — ABNORMAL HIGH (ref 3.6–11.0)

## 2015-09-27 LAB — COMPREHENSIVE METABOLIC PANEL
ALK PHOS: 59 U/L (ref 38–126)
ALT: 37 U/L (ref 14–54)
ANION GAP: 10 (ref 5–15)
AST: 32 U/L (ref 15–41)
Albumin: 3.1 g/dL — ABNORMAL LOW (ref 3.5–5.0)
BILIRUBIN TOTAL: 0.7 mg/dL (ref 0.3–1.2)
BUN: 114 mg/dL — ABNORMAL HIGH (ref 6–20)
CALCIUM: 8.9 mg/dL (ref 8.9–10.3)
CO2: 31 mmol/L (ref 22–32)
Chloride: 73 mmol/L — ABNORMAL LOW (ref 101–111)
Creatinine, Ser: 2.07 mg/dL — ABNORMAL HIGH (ref 0.44–1.00)
GFR, EST AFRICAN AMERICAN: 24 mL/min — AB (ref >60)
GFR, EST NON AFRICAN AMERICAN: 21 mL/min — AB (ref >60)
Glucose, Bld: 126 mg/dL — ABNORMAL HIGH (ref 65–99)
POTASSIUM: 7.4 mmol/L — AB (ref 3.5–5.1)
Sodium: 114 mmol/L — CL (ref 135–145)
TOTAL PROTEIN: 6.5 g/dL (ref 6.5–8.1)

## 2015-09-27 LAB — APTT: aPTT: 42 seconds — ABNORMAL HIGH (ref 24–36)

## 2015-09-27 LAB — TROPONIN I: TROPONIN I: 0.05 ng/mL — AB (ref <0.031)

## 2015-09-27 LAB — PROTIME-INR
INR: 2.22
Prothrombin Time: 24.4 seconds — ABNORMAL HIGH (ref 11.4–15.0)

## 2015-09-27 LAB — LIPASE, BLOOD: LIPASE: 163 U/L — AB (ref 11–51)

## 2015-09-27 LAB — LACTIC ACID, PLASMA: Lactic Acid, Venous: 3.7 mmol/L (ref 0.5–2.0)

## 2015-09-27 MED ORDER — MORPHINE SULFATE (PF) 2 MG/ML IV SOLN: 2.0000 mg | INTRAVENOUS | Status: DC | PRN

## 2015-09-27 MED ORDER — SODIUM BICARBONATE 8.4 % IV SOLN
50.0000 meq | Freq: Once | INTRAVENOUS | Status: AC
  Administered 2015-09-27: 50 meq via INTRAVENOUS
  Filled 2015-09-27: qty 50

## 2015-09-27 MED ORDER — ONDANSETRON HCL 4 MG/2ML IJ SOLN: 4.0000 mg | Freq: Four times a day (QID) | INTRAMUSCULAR | Status: DC | PRN

## 2015-09-27 MED ORDER — DEXTROSE 5 % IV SOLN: 2.0000 g | INTRAVENOUS | Status: DC

## 2015-09-27 MED ORDER — PANTOPRAZOLE SODIUM 40 MG IV SOLR: 40.0000 mg | Freq: Two times a day (BID) | INTRAVENOUS | Status: DC

## 2015-09-27 MED ORDER — SODIUM CHLORIDE 0.9 % IV SOLN: INTRAVENOUS | Status: DC

## 2015-09-27 MED ORDER — SODIUM POLYSTYRENE SULFONATE 15 GM/60ML PO SUSP: 60.0000 g | Freq: Once | ORAL | Status: DC

## 2015-09-27 MED ORDER — BIOTENE DRY MOUTH MT LIQD: 15.0000 mL | OROMUCOSAL | Status: DC | PRN

## 2015-09-27 MED ORDER — WARFARIN SODIUM 4 MG PO TABS: 4.0000 mg | ORAL_TABLET | Freq: Every day | ORAL | Status: DC

## 2015-09-27 MED ORDER — ACETAMINOPHEN 325 MG PO TABS: 650.0000 mg | ORAL_TABLET | Freq: Four times a day (QID) | ORAL | Status: DC | PRN

## 2015-09-27 MED ORDER — CEFEPIME HCL 2 G IJ SOLR
2.0000 g | Freq: Once | INTRAMUSCULAR | Status: AC
  Administered 2015-09-27: 2 g via INTRAVENOUS
  Filled 2015-09-27: qty 2

## 2015-09-27 MED ORDER — ONDANSETRON 4 MG PO TBDP: 4.0000 mg | ORAL_TABLET | Freq: Four times a day (QID) | ORAL | Status: DC | PRN

## 2015-09-27 MED ORDER — NOREPINEPHRINE BITARTRATE 1 MG/ML IV SOLN
4.0000 ug/min | Freq: Once | INTRAVENOUS | Status: AC
  Administered 2015-09-27: 4 ug/min via INTRAVENOUS
  Filled 2015-09-27: qty 4

## 2015-09-27 MED ORDER — SODIUM CHLORIDE 0.9 % IV SOLN
1.0000 g | Freq: Once | INTRAVENOUS | Status: AC
  Administered 2015-09-27: 1 g via INTRAVENOUS
  Filled 2015-09-27: qty 10

## 2015-09-27 MED ORDER — GLYCOPYRROLATE 1 MG PO TABS: 1.0000 mg | ORAL_TABLET | ORAL | Status: DC | PRN

## 2015-09-27 MED ORDER — SODIUM CHLORIDE 0.9 % IV BOLUS (SEPSIS)
1000.0000 mL | Freq: Once | INTRAVENOUS | Status: AC
  Administered 2015-09-27: 1000 mL via INTRAVENOUS

## 2015-09-27 MED ORDER — GLYCOPYRROLATE 0.2 MG/ML IJ SOLN: 0.2000 mg | INTRAMUSCULAR | Status: DC | PRN

## 2015-09-27 MED ORDER — POLYVINYL ALCOHOL 1.4 % OP SOLN: 1.0000 [drp] | Freq: Four times a day (QID) | OPHTHALMIC | Status: DC | PRN

## 2015-09-27 MED ORDER — MORPHINE SULFATE (PF) 2 MG/ML IV SOLN: 1.0000 mg | INTRAVENOUS | Status: DC | PRN

## 2015-09-27 MED ORDER — HYDROCORTISONE NA SUCCINATE PF 100 MG IJ SOLR
100.0000 mg | Freq: Once | INTRAMUSCULAR | Status: AC
  Administered 2015-09-27: 100 mg via INTRAVENOUS
  Filled 2015-09-27: qty 2

## 2015-09-27 MED ORDER — SODIUM BICARBONATE 8.4 % IV SOLN
INTRAVENOUS | Status: DC
  Administered 2015-09-27: via INTRAVENOUS
  Filled 2015-09-27 (×2): qty 150

## 2015-09-27 MED ORDER — DEXTROSE 50 % IV SOLN
INTRAVENOUS | Status: AC
  Filled 2015-09-27: qty 50

## 2015-09-27 MED ORDER — CALCIUM GLUCONATE 10 % IV SOLN
1.0000 g | Freq: Once | INTRAVENOUS | Status: DC
Start: 2015-09-27 — End: 2015-09-27

## 2015-09-27 MED ORDER — SODIUM CHLORIDE 0.9 % IV BOLUS (SEPSIS)
1000.0000 mL | INTRAVENOUS | Status: AC
  Administered 2015-09-27 (×3): 1000 mL via INTRAVENOUS

## 2015-09-27 MED ORDER — HEPARIN SODIUM (PORCINE) 5000 UNIT/ML IJ SOLN: 5000.0000 [IU] | Freq: Three times a day (TID) | INTRAMUSCULAR | Status: DC

## 2015-09-27 MED ORDER — CALCIUM GLUCONATE 10 % IV SOLN
INTRAVENOUS | Status: AC
  Filled 2015-09-27: qty 20

## 2015-09-27 MED ORDER — CALCIUM GLUCONATE 10 % IV SOLN: 1.0000 g | Freq: Once | INTRAVENOUS | Status: DC

## 2015-09-27 MED ORDER — VANCOMYCIN HCL IN DEXTROSE 1-5 GM/200ML-% IV SOLN
1000.0000 mg | Freq: Once | INTRAVENOUS | Status: AC
  Administered 2015-09-27: 1000 mg via INTRAVENOUS
  Filled 2015-09-27: qty 200

## 2015-09-27 MED ORDER — SODIUM CHLORIDE 0.9 % IJ SOLN: 3.0000 mL | Freq: Two times a day (BID) | INTRAMUSCULAR | Status: DC

## 2015-09-27 MED ORDER — SODIUM CHLORIDE 0.9 % IV SOLN
1.0000 g | Freq: Once | INTRAVENOUS | Status: DC
  Administered 2015-09-27: 1 g via INTRAVENOUS
  Filled 2015-09-27: qty 10

## 2015-09-27 MED ORDER — ACETAMINOPHEN 650 MG RE SUPP: 650.0000 mg | Freq: Four times a day (QID) | RECTAL | Status: DC | PRN

## 2015-09-27 MED ORDER — VANCOMYCIN HCL IN DEXTROSE 1-5 GM/200ML-% IV SOLN: 1000.0000 mg | INTRAVENOUS | Status: DC

## 2015-09-27 MED ORDER — SODIUM CHLORIDE 0.9 % IV SOLN
1.0000 g | Freq: Once | INTRAVENOUS | Status: DC
  Filled 2015-09-27: qty 10

## 2015-09-27 MED ORDER — INSULIN ASPART 100 UNIT/ML ~~LOC~~ SOLN: 0.0000 [IU] | Freq: Four times a day (QID) | SUBCUTANEOUS | Status: DC

## 2015-09-27 MED ORDER — SODIUM CHLORIDE 0.9 % IV SOLN
1.0000 g | Freq: Once | INTRAVENOUS | Status: AC
  Administered 2015-09-27: 1 g via INTRAVENOUS

## 2015-09-27 MED ORDER — LEVOTHYROXINE SODIUM 125 MCG PO TABS: 125.0000 ug | ORAL_TABLET | Freq: Every day | ORAL | Status: DC

## 2015-09-27 MED ORDER — EPINEPHRINE HCL 1 MG/ML IJ SOLN
1.0000 ug/min | INTRAVENOUS | Status: DC
  Filled 2015-09-27: qty 4

## 2015-09-27 NOTE — Progress Notes (Signed)
ANTIBIOTIC CONSULT NOTE - INITIAL  Pharmacy Consult for cefepime/vancomycin Indication: rule out sepsis  Allergies  Allergen Reactions  . Codeine Shortness Of Breath    Stop breathing   . Oxycodone Other (See Comments)    Reaction: unknown  . Adhesive [Tape] Itching and Rash    Skin turns red and itching     Patient Measurements: Height:  (170.2 cm) Weight: 180 lb (81.647 kg) IBW/kg (Calculated) : 61.6 Adjusted Body Weight:   Vital Signs: Temp: 89.6 F (32 C) (01/18 2115) Temp Source: Rectal (01/18 2115) BP: 125/56 mmHg (01/18 2300) Pulse Rate: 79 (01/18 2300) Intake/Output from previous day:   Intake/Output from this shift: Total I/O In: -  Out: 20 [Urine:20]  Labs:  Recent Labs  08-Oct-2015 2112  WBC 26.7*  HGB 13.2  PLT 152  CREATININE 2.07*   Estimated Creatinine Clearance: 22.6 mL/min (by C-G formula based on Cr of 2.07). No results for input(s): VANCOTROUGH, VANCOPEAK, VANCORANDOM, GENTTROUGH, GENTPEAK, GENTRANDOM, TOBRATROUGH, TOBRAPEAK, TOBRARND, AMIKACINPEAK, AMIKACINTROU, AMIKACIN in the last 72 hours.   Microbiology: Recent Results (from the past 720 hour(s))  MRSA PCR Screening     Status: None   Collection Time: 09/14/15  3:09 PM  Result Value Ref Range Status   MRSA by PCR NEGATIVE NEGATIVE Final    Comment:        The GeneXpert MRSA Assay (FDA approved for NASAL specimens only), is one component of a comprehensive MRSA colonization surveillance program. It is not intended to diagnose MRSA infection nor to guide or monitor treatment for MRSA infections.   MRSA PCR Screening     Status: None   Collection Time: 09/15/15  4:24 AM  Result Value Ref Range Status   MRSA by PCR NEGATIVE NEGATIVE Final    Comment:        The GeneXpert MRSA Assay (FDA approved for NASAL specimens only), is one component of a comprehensive MRSA colonization surveillance program. It is not intended to diagnose MRSA infection nor to guide or monitor  treatment for MRSA infections.     Medical History: Past Medical History  Diagnosis Date  . CHF (congestive heart failure) (HCC)   . Anemia   . Mitral valve disorder   . Atrial fibrillation (HCC)   . Pure hypercholesterolemia   . Proteinuria     Medications:  Infusions:  . calcium gluconate 1 GM IV    .  sodium bicarbonate  infusion 1000 mL 125 mL/hr at 2015/10/08 2330  . sodium chloride 1,000 mL (10-08-15 2300)  . [DISCONTINUED] calcium gluconate 1 GM IV 1 g (2015/10/08 2320)   Assessment: 85 yof cc AMS recently admitted/[facility] with respiratory failure possible PNA that required intubation. CXR in ED shows worsening PNA, pharmacy consulted to dose vancomycin and cefepime.  Vd 48.5 L, Ke 0.023 hr-1, T1/2 30 hr  Goal of Therapy:  Vancomycin trough level 15-20 mcg/ml  Plan:  Expected duration 7 days with resolution of temperature and/or normalization of WBC. Cefepime 2 gm IV Q24H for CrCl 11 to 29 mL/min, vancomycin 1 gm IV Q36H with stacked dosing, second dose approximately 15 hours after first, predicted trough 16 mcg/mL. Pharmacy will continue to follow and adjust as needed to maintain trough 15 to 20 mcg/mL.  Carola Frost, Pharm.D., BCPS Clinical Pharmacist 08-Oct-2015,11:32 PM

## 2015-09-27 NOTE — ED Notes (Signed)
Consult MD at bedside.

## 2015-09-27 NOTE — H&P (Signed)
[facility] - Lori Harrington at [facility]   PATIENT NAME: Lori Harrington    MR#:  [id number]  DATE OF BIRTH:  09-Oct-1929  DATE OF ADMISSION:  09/14/2015  PRIMARY CARE PHYSICIAN: No primary care provider on file.   REQUESTING/REFERRING PHYSICIAN: Scotty Court, MD  CHIEF COMPLAINT:   Chief Complaint  Patient presents with  . Altered Mental Status    HISTORY OF PRESENT ILLNESS:  Lori Harrington  is a 82 y.o. female who presents with septic shock.  Patient was recently admitted at [facility] in [place] with respiratory failure. Patient does not contribute to her own history of present illness today, history is taken from her son who is present in the ED with her. Her son states that they were told initially that she might have a pneumonia, but her respiratory failure required intubation for some time. He was after that the patient stated that she did not ever want to be intubated or in the critical care unit again with such invasive aggressive measures. She seemed to improve after the treatment and was discharged to a nursing facility. Several days after admission there she began to have decreased mental status. Today she was found to be unresponsive and with labored breathing. Here in the ED she was found to be hypoxic, with an elevated white count, hyponatremic, hyperkalemic, acidotic with a pH of 6.9. Lactic acid is elevated. Chest x-ray shows worsening multi focal pneumonia. Given that the patient did not wish for intubation or invasive measures such as central line placement, and per conversation with the son hospitalists were called for admission for antibiotics and fluid administration with understanding that the patient is gravely ill and there is a high probability that she may pass without further aggressive invasive treatments. Son voiced understanding of the situation, and maintained that his mother stated she did not want critical care level treatment.  PAST  MEDICAL HISTORY:   Past Medical History  Diagnosis Date  . CHF (congestive heart failure) (HCC)   . Anemia   . Mitral valve disorder   . Atrial fibrillation (HCC)   . Pure hypercholesterolemia   . Proteinuria     PAST SURGICAL HISTORY:   Past Surgical History  Procedure Laterality Date  . Abdominal hysterectomy    . Cardiac surgery    . Total knee arthroplasty Left     SOCIAL HISTORY:   Social History  Substance Use Topics  . Smoking status: Never Smoker   . Smokeless tobacco: Not on file  . Alcohol Use: No    FAMILY HISTORY:   Family History  Problem Relation Age of Onset  . Hypertension Mother   . CAD Father   . Diabetes Brother   . Asthma Mother     DRUG ALLERGIES:   Allergies  Allergen Reactions  . Adhesive [Tape]     Skin turns red and itching   . Codeine     Stop breathing   . Oxycodone     unknown    MEDICATIONS AT HOME:   Prior to Admission medications   Medication Sig Start Date End Date Taking? Authorizing Provider  allopurinol (ZYLOPRIM) 300 MG tablet Take 300 mg by mouth daily.    Historical Provider, MD  Cholecalciferol (VITAMIN D-3) 1000 units CAPS Take 1 capsule by mouth daily.    Historical Provider, MD  colchicine 0.6 MG tablet Take 0.6 mg by mouth daily as needed. For gout    Historical Provider, MD  docusate sodium (COLACE) 100  MG capsule Take 100 mg by mouth daily.     Historical Provider, MD  feeding supplement, GLUCERNA SHAKE, (GLUCERNA SHAKE) LIQD Take 237 mLs by mouth 3 (three) times daily between meals. 09/22/15   Rodolph Bong, MD  furosemide (LASIX) 40 MG tablet Take 1 tablet (40 mg total) by mouth 2 (two) times daily. 09/22/15   Rodolph Bong, MD  gabapentin (NEURONTIN) 300 MG capsule Take 300 mg by mouth 3 (three) times daily.    Historical Provider, MD  guaiFENesin (MUCINEX) 600 MG 12 hr tablet Take 2 tablets (1,200 mg total) by mouth 2 (two) times daily. Take for 3 days then as needed. 09/22/15   Rodolph Bong, MD   guaiFENesin (ROBITUSSIN) 100 MG/5ML SOLN Take 5 mLs (100 mg total) by mouth every 4 (four) hours as needed for cough or to loosen phlegm. 09/22/15   Rodolph Bong, MD  insulin glargine (LANTUS) 100 UNIT/ML injection Inject 40-44 Units into the skin at bedtime.    Historical Provider, MD  Insulin Lispro (HUMALOG KWIKPEN [place]) Inject 20-30 units of lipase into the skin See admin instructions. Take 30 units at breakfast, 20 units at lunch, 28 units at supper per sliding scale    Historical Provider, MD  isosorbide-hydrALAZINE (BIDIL) 20-37.5 MG tablet Take 1 tablet by mouth 3 (three) times daily. 09/22/15   Rodolph Bong, MD  levofloxacin (LEVAQUIN) 750 MG tablet Take 1 tablet (750 mg total) by mouth every other day. Take for 10 days 09/22/15   Rodolph Bong, MD  levothyroxine (SYNTHROID, LEVOTHROID) 125 MCG tablet Take 125 mcg by mouth daily before breakfast.    Historical Provider, MD  metFORMIN (GLUCOPHAGE) 500 MG tablet Take 500 mg by mouth 2 (two) times daily with a meal.    Historical Provider, MD  metoprolol succinate (TOPROL-XL) 25 MG 24 hr tablet Take 1 tablet (25 mg total) by mouth 2 (two) times daily. 09/22/15   Rodolph Bong, MD  ondansetron (ZOFRAN) 4 MG tablet Take 4 mg by mouth every 8 (eight) hours as needed for nausea or vomiting.    Historical Provider, MD  potassium chloride SA (K-DUR,KLOR-CON) 20 MEQ tablet Take 20 mEq by mouth 2 (two) times daily.    Historical Provider, MD  predniSONE (DELTASONE) 20 MG tablet Take 1-3 tablets (20-60 mg total) by mouth daily before breakfast. Take 3 tablets ( ) daily x 2 day, then 2 tablets ( ) daily x 4 days, then 1 tablet ( )daily x 4 days, then stop. 09/22/15   Rodolph Bong, MD  senna-docusate (SENOKOT-S) 8.6-50 MG tablet Take 1 tablet by mouth at bedtime. 09/22/15   Rodolph Bong, MD  simvastatin (ZOCOR) 20 MG tablet Take 20 mg by mouth at bedtime.    Historical Provider, MD  traMADol (ULTRAM) 50 MG tablet Take 1 tablet  (50 mg total) by mouth every 6 (six) hours as needed for moderate pain. 09/22/15   Rodolph Bong, MD  vitamin B-12 (CYANOCOBALAMIN) 1000 MCG tablet Take 1,000 mcg by mouth daily.    Historical Provider, MD  warfarin (COUMADIN) 4 MG tablet Take 4 mg by mouth daily.    Historical Provider, MD    REVIEW OF SYSTEMS:  Review of Systems  Unable to perform ROS: critical illness     VITAL SIGNS:   Filed Vitals:   10/17/15 2220 10-17-2015 2225 2015-10-17 2240 October 17, 2015 2300  BP: 98/50 116/67 141/58 125/56  Pulse: 70 76 76 79  Temp:  TempSrc:      Resp: 18 17 17 15   Height:      Weight:      SpO2: 88% 92% 92% 88%   Wt Readings from Last 3 Encounters:  10/07/2015 81.647 kg (180 lb)  09/22/15 86.365 kg (190 lb 6.4 oz)    PHYSICAL EXAMINATION:  Physical Exam  Vitals reviewed. Constitutional: She appears well-developed and well-nourished. No distress.  HENT:  Head: Normocephalic and atraumatic.  Nose: Nose normal.  Eyes: Conjunctivae are normal. Pupils are equal, round, and reactive to light. No scleral icterus.  Neck: Normal range of motion. Neck supple. No JVD present. No thyromegaly present.  Cardiovascular: Normal rate, regular rhythm and intact distal pulses.  Exam reveals no gallop and no friction rub.   No murmur heard. Respiratory: She is in respiratory distress. She has wheezes. She has rales.  GI: Soft. Bowel sounds are normal. She exhibits no distension. There is no tenderness.  Musculoskeletal: Normal range of motion. She exhibits no edema.  No arthritis, no gout  Lymphadenopathy:    She has no cervical adenopathy.  Neurological:  Unable to assess due to critical illness  Skin: Skin is warm and dry. No rash noted. No erythema.  Psychiatric:  Unable to assess due to critical illness    LABORATORY PANEL:   CBC  Recent Labs Lab 09/10/2015 2112  WBC 26.7*  HGB 13.2  HCT 43.3  PLT 152    ------------------------------------------------------------------------------------------------------------------  Chemistries   Recent Labs Lab 09/29/2015 2112  NA 114*  K 7.4*  CL 73*  CO2 31  GLUCOSE 126*  BUN 114*  CREATININE 2.07*  CALCIUM 8.9  AST 32  ALT 37  ALKPHOS 59  BILITOT 0.7   ------------------------------------------------------------------------------------------------------------------  Cardiac Enzymes  Recent Labs Lab 09/17/2015 2112  TROPONINI 0.05*   ------------------------------------------------------------------------------------------------------------------  RADIOLOGY:  Dg Chest Port 1 View  09/13/2015  CLINICAL DATA:  Acute onset of sepsis. Decreased left-sided breath sounds. Patient only minimally responsive. Initial encounter. EXAM: PORTABLE CHEST 1 VIEW COMPARISON:  Chest radiograph from 09/18/2015 FINDINGS: There is relatively dense diffuse airspace opacification involving the left lung, and mild right perihilar opacity, concerning for multifocal pneumonia. Underlying mild vascular congestion is noted. A small left pleural effusion is suspected. No pneumothorax is seen. The cardiomediastinal silhouette is mildly enlarged. The patient is status post CABG. No acute osseous abnormalities are seen. IMPRESSION: Marked worsening of relatively severe multifocal pneumonia, predominantly on the left. Underlying vascular congestion and mild cardiomegaly. Suspect small left pleural effusion Electronically Signed   By: Roanna Raider M.D.   On: 10/02/2015 21:23    EKG:   Orders placed or performed during the hospital encounter of 10/06/2015  . EKG 12-Lead  . EKG 12-Lead    IMPRESSION AND PLAN:  Principal Problem:   Septic shock (HCC) - per discussion with her son, we are not pursuing aggressive invasive measures at this time. Patient is DO NOT RESUSCITATE DO NOT INTUBATE, and also does not want Central line placement. Her son understands that without a  central line we are not able to administer vasopressor support other than IV fluids. We will continue aggressive IV fluids and IV antibiotics at this time. We will use IV bicarbonate to try and temporize her acidosis. However, per conversation with her son it is understood that the patient is critically ill, likely to pass. I have ordered some comfort measures at this time as her son states that if she is going to pass he does want her  to be kept comfortable. Active Problems:   HCAP (healthcare-associated pneumonia) - antibiotics and other support as above   Hyponatremia - aggressive IV fluids at this time, normal saline.   Hyperkalemia - likely due to her acute renal failure. We'll administer Kayexalate.   Acute on chronic kidney failure (HCC) - avoid nephrotoxins, IV fluids as above.   Diabetes mellitus, type 2 (HCC) - sliding scale insulin with glucose checks every 6 hours while patient is nothing by mouth.  All the records are reviewed and case discussed with ED provider. Management plans discussed with the patient and/or family.  DVT PROPHYLAXIS: SubQ heparin  GI PROPHYLAXIS: PPI  ADMISSION STATUS: Inpatient  CODE STATUS: DNR Code Status History    Date Active Date Inactive Code Status Order ID Comments User Context   09/15/2015  9:59 AM 09/22/2015  6:10 PM DNR [id number]  Nelda Bucks, MD Inpatient   09/14/2015  1:14 PM 09/15/2015  6:08 AM Full Code [id number]  Meredith Pel, NP Inpatient    Questions for Most Recent Historical Code Status (Order [id number])    Question Answer Comment   In the event of cardiac or respiratory ARREST Do not call a "code blue"    In the event of cardiac or respiratory ARREST Do not perform Intubation, CPR, defibrillation or ACLS    In the event of cardiac or respiratory ARREST Use medication by any route, position, wound care, and other measures to relive pain and suffering. May use oxygen, suction and manual treatment of airway obstruction as needed for  comfort.       TOTAL CRITICAL CARE TIME TAKING CARE OF THIS PATIENT: 45 minutes.    Jakel Alphin FIELDING 10/05/2015, 11:09 PM  [facility]  Office  [phone]  CC: Primary care physician; No primary care provider on file.

## 2015-09-27 NOTE — ED Notes (Signed)
Pt arrived to ED vis EMS from [facility] with reported altered LOC, hypotension and low oxygen sats. Pt reponsive to sternal rub only en route per EMS. EMS reports room air sats of 78% upon arrival at nursing facility. Pt sats improved to 90% on 15L NRB. EMS reports PMH of PNA with [facility] last week.

## 2015-09-27 NOTE — ED Provider Notes (Signed)
[facility] Emergency Department Provider Note  ____________________________________________  Time seen: 8:55 PM on arrival by EMS  I have reviewed the triage vital signs and the nursing notes.   HISTORY  Chief Complaint Altered Mental Status Level 5 caveat:  Portions of the history and physical were unable to be obtained due to the patient's acute illness  History obtained from EMS  HPI Lori Harrington is a 82 y.o. female sent to the ED from nursing home due to altered mental status. The patient has had progressive diminished level of consciousness over the last 2 days. Also reported to be hypoxic on room air by EMSto about 78%. On nonrebreather her highest oxygen saturation for them was about 90%. He also reported a blood pressure of about 80/50. Was recently admitted to [facility] and treated for pneumonia.     Past Medical History  Diagnosis Date  . CHF (congestive heart failure) (HCC)   . Anemia   . Mitral valve disorder   . Atrial fibrillation (HCC)   . Pure hypercholesterolemia   . Proteinuria      Patient Active Problem List   Diagnosis Date Noted  . Type 2 diabetes, HbA1c goal < 7% (HCC)   . Renal failure (ARF), acute on chronic (HCC) 09/20/2015  . Acute on chronic combined systolic and diastolic CHF (congestive heart failure) (HCC) 09/20/2015  . Acute bronchitis 09/20/2015  . Muscular deconditioning   . Chronic atrial fibrillation (HCC)   . Acute respiratory failure with hypoxemia (HCC)   . Hypokalemia   . AKI (acute kidney injury) (HCC)   . Acute respiratory failure with hypoxia (HCC)   . Acute on chronic congestive heart failure (HCC)   . Acute pulmonary edema (HCC)   . Acute exacerbation of CHF (congestive heart failure) (HCC) 09/14/2015  . Diabetes mellitus, type 2 (HCC) 09/14/2015  . Acute respiratory failure (HCC) 09/14/2015  . Gout 09/14/2015  . Atrial fibrillation (HCC) 09/14/2015  . Hypothyroid 09/14/2015  . HTN  (hypertension) 09/14/2015  . Thrombocytopenia (HCC) 09/14/2015  . Heart failure (HCC) 09/14/2015  . Essential hypertension      Past Surgical History  Procedure Laterality Date  . Abdominal hysterectomy    . Cardiac surgery    . Total knee arthroplasty Left      Current Outpatient Rx  Name  Route  Sig  Dispense  Refill  . allopurinol (ZYLOPRIM) 300 MG tablet   Oral   Take 300 mg by mouth daily.         . Cholecalciferol (VITAMIN D-3) 1000 units CAPS   Oral   Take 1 capsule by mouth daily.         . colchicine 0.6 MG tablet   Oral   Take 0.6 mg by mouth daily as needed. For gout         . docusate sodium (COLACE) 100 MG capsule   Oral   Take 100 mg by mouth daily.          . feeding supplement, GLUCERNA SHAKE, (GLUCERNA SHAKE) LIQD   Oral   Take 237 mLs by mouth 3 (three) times daily between meals.      0   . furosemide (LASIX) 40 MG tablet   Oral   Take 1 tablet (40 mg total) by mouth 2 (two) times daily.   60 tablet   0   . gabapentin (NEURONTIN) 300 MG capsule   Oral   Take 300 mg by mouth 3 (three) times daily.         Marland Kitchen  guaiFENesin (MUCINEX) 600 MG 12 hr tablet   Oral   Take 2 tablets (1,200 mg total) by mouth 2 (two) times daily. Take for 3 days then as needed.   15 tablet   0   . guaiFENesin (ROBITUSSIN) 100 MG/5ML SOLN   Oral   Take 5 mLs (100 mg total) by mouth every 4 (four) hours as needed for cough or to loosen phlegm.   1200 mL   0   . insulin glargine (LANTUS) 100 UNIT/ML injection   Subcutaneous   Inject 40-44 Units into the skin at bedtime.         . Insulin Lispro (HUMALOG KWIKPEN [place])   Subcutaneous   Inject 20-30 units of lipase into the skin See admin instructions. Take 30 units at breakfast, 20 units at lunch, 28 units at supper per sliding scale         . isosorbide-hydrALAZINE (BIDIL) 20-37.5 MG tablet   Oral   Take 1 tablet by mouth 3 (three) times daily.   90 tablet   0   . levofloxacin (LEVAQUIN) 750 MG  tablet   Oral   Take 1 tablet (750 mg total) by mouth every other day. Take for 10 days   1 tablet   0   . levothyroxine (SYNTHROID, LEVOTHROID) 125 MCG tablet   Oral   Take 125 mcg by mouth daily before breakfast.         . metFORMIN (GLUCOPHAGE) 500 MG tablet   Oral   Take 500 mg by mouth 2 (two) times daily with a meal.         . metoprolol succinate (TOPROL-XL) 25 MG 24 hr tablet   Oral   Take 1 tablet (25 mg total) by mouth 2 (two) times daily.   60 tablet   0   . ondansetron (ZOFRAN) 4 MG tablet   Oral   Take 4 mg by mouth every 8 (eight) hours as needed for nausea or vomiting.         . potassium chloride SA (K-DUR,KLOR-CON) 20 MEQ tablet   Oral   Take 20 mEq by mouth 2 (two) times daily.         . predniSONE (DELTASONE) 20 MG tablet   Oral   Take 1-3 tablets (20-60 mg total) by mouth daily before breakfast. Take 3 tablets ( ) daily x 2 day, then 2 tablets ( ) daily x 4 days, then 1 tablet ( )daily x 4 days, then stop.   20 tablet   0   . senna-docusate (SENOKOT-S) 8.6-50 MG tablet   Oral   Take 1 tablet by mouth at bedtime.         . simvastatin (ZOCOR) 20 MG tablet   Oral   Take 20 mg by mouth at bedtime.         . traMADol (ULTRAM) 50 MG tablet   Oral   Take 1 tablet (50 mg total) by mouth every 6 (six) hours as needed for moderate pain.   30 tablet   0   . vitamin B-12 (CYANOCOBALAMIN) 1000 MCG tablet   Oral   Take 1,000 mcg by mouth daily.         Marland Kitchen warfarin (COUMADIN) 4 MG tablet   Oral   Take 4 mg by mouth daily.            Allergies Adhesive; Codeine; and Oxycodone   Family History  Problem Relation Age of Onset  . Hypertension Mother   . CAD Father   .  Diabetes Brother   . Asthma Mother     Social History Social History  Substance Use Topics  . Smoking status: Never Smoker   . Smokeless tobacco: Not on file  . Alcohol Use: No    Review of Systems Unable to obtain due to acute critical illness and  obtunded  ____________________________________________   PHYSICAL EXAM:  VITAL SIGNS: ED Triage Vitals  Enc Vitals Group     BP 09/11/2015 2110 89/47 mmHg     Pulse Rate 09/21/2015 2110 64     Resp 09/23/2015 2110 18     Temp 09/18/2015 2115 89.6 F (32 C)     Temp Source 10/02/2015 2115 Rectal     SpO2 10/05/2015 2100 90 %     Weight 09/15/2015 2110 180 lb (81.647 kg)     Height 09/23/2015 2110  (1.702 m)     Head Cir --      Peak Flow --      Pain Score --      Pain Loc --      Pain Edu? --      Excl. in GC? --     Vital signs reviewed, nursing assessments reviewed.   Constitutional:  Obtunded, unresponsive. Severe respiratory distress Eyes:   No scleral icterus. No conjunctival pallor. PERRL.  ENT   Head:   Normocephalic and atraumatic.   Nose:   No congestion/rhinnorhea. No septal hematoma   Mouth/Throat:   Dry mucous membranes, no pharyngeal erythema. No peritonsillar mass. No uvula shift.   Neck:   No stridor. No SubQ emphysema. No meningismus. Hematological/Lymphatic/Immunilogical:   No cervical lymphadenopathy. Cardiovascular:   RRR. Normal and symmetric distal pulses are present in all extremities. No murmurs, rubs, or gallops. Respiratory:   Accessory muscle use. Markedly diminished breath sounds in the left lung diffusely, progress breath sounds on the right lung Gastrointestinal:   Soft and nontender. No distention. There is no CVA tenderness.  No rebound, rigidity, or guarding. Genitourinary:   deferred Musculoskeletal:   Nontender with normal range of motion in all extremities. No joint effusions.  No lower extremity tenderness.  No edema. Neurologic:   Unresponsive.  Flinches to light stimulus to the eyes  Withdraws to pain GCS = E2V1M3 = 6 Skin:    Skin is warm, dry and intact. No rash noted.  No petechiae, purpura, or bullae.  ____________________________________________    LABS (pertinent positives/negatives) (all labs ordered are listed, but  only abnormal results are displayed) Labs Reviewed  LACTIC ACID, PLASMA - Abnormal; Notable for the following:    Lactic Acid, Venous 3.7 (*)    All other components within normal limits  COMPREHENSIVE METABOLIC PANEL - Abnormal; Notable for the following:    Sodium 114 (*)    Potassium 7.4 (*)    Chloride 73 (*)    Glucose, Bld 126 (*)    BUN 114 (*)    Creatinine, Ser 2.07 (*)    Albumin 3.1 (*)    GFR calc non Af Amer 21 (*)    GFR calc Af Amer 24 (*)    All other components within normal limits  LIPASE, BLOOD - Abnormal; Notable for the following:    Lipase 163 (*)    All other components within normal limits  TROPONIN I - Abnormal; Notable for the following:    Troponin I 0.05 (*)    All other components within normal limits  CBC WITH DIFFERENTIAL/PLATELET - Abnormal; Notable for the following:  WBC 26.7 (*)    MCHC 30.5 (*)    RDW 16.4 (*)    Neutro Abs 25.3 (*)    Lymphs Abs 0.4 (*)    All other components within normal limits  APTT - Abnormal; Notable for the following:    aPTT 42 (*)    All other components within normal limits  PROTIME-INR - Abnormal; Notable for the following:    Prothrombin Time 24.4 (*)    All other components within normal limits  URINALYSIS COMPLETEWITH MICROSCOPIC ([facility] ONLY) - Abnormal; Notable for the following:    Color, Urine YELLOW (*)    APPearance HAZY (*)    Leukocytes, UA TRACE (*)    Bacteria, UA RARE (*)    Squamous Epithelial / LPF 0-5 (*)    All other components within normal limits  BLOOD GAS, VENOUS - Abnormal; Notable for the following:    pH, Ven 6.99 (*)    pCO2, Ven >128 (*)    All other components within normal limits  CULTURE, BLOOD (ROUTINE X 2)  CULTURE, BLOOD (ROUTINE X 2)  URINE CULTURE  CULTURE, EXPECTORATED SPUTUM-ASSESSMENT  LACTIC ACID, PLASMA   ____________________________________________   EKG    ____________________________________________    RADIOLOGY  Chest x-ray shows significant  worsening of multifocal pneumonia, left greater than right  ____________________________________________   PROCEDURES CRITICAL CARE Performed by: Scotty Court, Roya Gieselman   Total critical care time:  Critical care time was exclusive of separately billable procedures and treating other patients.  Critical care was necessary to treat or prevent imminent or life-threatening deterioration.  Critical care was time spent personally by me on the following activities: development of treatment plan with patient and/or surrogate as well as nursing, discussions with consultants, evaluation of patient's response to treatment, examination of patient, obtaining history from patient or surrogate, ordering and performing treatments and interventions, ordering and review of laboratory studies, ordering and review of radiographic studies, pulse oximetry and re-evaluation of patient's condition.   ____________________________________________   INITIAL IMPRESSION / ASSESSMENT AND PLAN / ED COURSE  Pertinent labs & imaging results that were available during my care of the patient were reviewed by me and considered in my medical decision making (see chart for details).  Patient presents with hypoxia, hypotension, altered mental status. Code sepsis called immediately upon initial assessment. We'll begin 3 L IV saline bolus, empiric antibiotics with vancomycin and cefepime, plan for ICU admission. I see was full at this hospital so we'll plan for transfer as we have some labs. Aggressive resuscitation at this time.  ----------------------------------------- 10:43 PM on Oct 15, 2015 -----------------------------------------  Patient did not significantly respond to initial IV saline boluses so norepinephrine was started through a peripheral IV while planning for advanced invasive care with the family. The son was present at the bedside and we had an extensive discussion with him about vasopressor use and  central venous catheter placement. The patient does have a DO NOT RESUSCITATE order. After he discussed with other concerned family members, he confirms that the family wishes to respect her DO NOT RESUSCITATE order which I agree with. They also do not think that she would want to have a central venous catheter or any other invasive procedures at this time. Due to her obtundation she is not a candidate for BiPAP for her severe respiratory acidosis and hypercapnic respiratory failure and her advanced directive precludes intubation. At this time we'll continue with medical therapy without any invasive procedures. Family understands that outlook is not good and  the patient is likely to die in the hospital. Case discussed with Dr. Anne Hahn the hospitalist for admission to the floor.  Septic shock - cont ivf, iv abx, iv steroids given, PIV norepi running Hyperkalemia - iv calcium glucagon, sodium bicarbonate, ivf Hypoxic/hypercapnic resp. Failure - NRB O2. No intubation or bipap.  If more alert could add bipap.      ____________________________________________   FINAL CLINICAL IMPRESSION(S) / ED DIAGNOSES  Final diagnoses:  Septic shock (HCC)  Bilateral pneumonia  Acute respiratory failure with hypoxia and hypercapnia (HCC)  Acute hyperkalemia  Acute hyponatremia  Obtunded  DNR (do not resuscitate)      Sharman Cheek, MD 2015/10/01 2248

## 2015-09-29 LAB — URINE CULTURE: Culture: >=100000

## 2015-10-02 LAB — CULTURE, BLOOD (ROUTINE X 2)
CULTURE: NO GROWTH
Culture: NO GROWTH

## 2015-10-11 NOTE — ED Notes (Signed)
Pt family at bedside

## 2015-10-11 NOTE — ED Notes (Signed)
Time of death 0027 on 09/15/2015. Pt pronounced by Dr. Anne Hahn.

## 2015-10-11 NOTE — ED Notes (Signed)
Family remains at bedside.

## 2015-10-11 NOTE — Discharge Summary (Signed)
[facility] [place] at [facility]    Death Note - please see Last Note for all details.  PATIENT NAME: Lori Harrington    MR#:  [id number]  DATE OF BIRTH:  [birth year]  DATE OF ADMISSION:  10/09/15  PRIMARY CARE PHYSICIAN: No primary care provider on file.   ADMISSION DIAGNOSIS:  ams  HISTORY OF PRESENT ILLNESS ON ADMISSION (as per Dr Anne Hahn):  [name] is a 82 y.o. female who presents with septic shock. Patient was recently admitted at [facility] in [place] with respiratory failure. Patient does not contribute to her own history of present illness today, history is taken from her son who is present in the ED with her. Her son states that they were told initially that she might have a pneumonia, but her respiratory failure required intubation for some time. He was after that the patient stated that she did not ever want to be intubated or in the critical care unit again with such invasive aggressive measures. She seemed to improve after the treatment and was discharged to a nursing facility. Several days after admission there she began to have decreased mental status. Today she was found to be unresponsive and with labored breathing. Here in the ED she was found to be hypoxic, with an elevated white count, hyponatremic, hyperkalemic, acidotic with a pH of 6.9. Lactic acid is elevated. Chest x-ray shows worsening multi focal pneumonia. Given that the patient did not wish for intubation or invasive measures such as central line placement, and per conversation with the son hospitalists were called for admission for antibiotics and fluid administration with understanding that the patient is gravely ill and there is a high probability that she may pass without further aggressive invasive treatments. Son voiced understanding of the situation, and maintained that his mother stated she did not want critical care level treatment.  HOSPITAL COURSE:  Patient  continued to decompensate despite medical therapy and was pronounced dead by Kristeen Miss, MD on 09/18/2015 at 12:27 AM after 1 full minute of auscultation revealed absent heart and lung sounds, absent corneal and pupillary reflexes, and absent response to painful stimuli.                  Cause of death: Septic shock and multi organ failure secondary to Pneumonia   Eban Weick FIELDING 09/19/2015, 1:09 AM  [facility] - [place] at [facility]    OFFICE [phone]  Total clinical and documentation time for today: <30 minutes

## 2015-10-11 DEATH — deceased

## 2015-10-20 ENCOUNTER — Inpatient Hospital Stay: Payer: Medicare Other | Admitting: Acute Care
